# Patient Record
Sex: Female | Born: 1960 | Race: White | Hispanic: No | Marital: Married | Smoking: Never smoker
Health system: Southern US, Community
[De-identification: ages and names within clinical notes are randomized; demographics above are authoritative.]

## PROBLEM LIST (undated history)

## (undated) DIAGNOSIS — R011 Cardiac murmur, unspecified: Secondary | ICD-10-CM

## (undated) DIAGNOSIS — Z8371 Family history of colonic polyps: Secondary | ICD-10-CM

## (undated) DIAGNOSIS — Z83719 Family history of colon polyps, unspecified: Secondary | ICD-10-CM

## (undated) DIAGNOSIS — IMO0002 Reserved for concepts with insufficient information to code with codable children: Secondary | ICD-10-CM

## (undated) DIAGNOSIS — R7611 Nonspecific reaction to tuberculin skin test without active tuberculosis: Secondary | ICD-10-CM

## (undated) DIAGNOSIS — E785 Hyperlipidemia, unspecified: Secondary | ICD-10-CM

## (undated) DIAGNOSIS — M722 Plantar fascial fibromatosis: Secondary | ICD-10-CM

## (undated) DIAGNOSIS — M810 Age-related osteoporosis without current pathological fracture: Secondary | ICD-10-CM

## (undated) DIAGNOSIS — H359 Unspecified retinal disorder: Secondary | ICD-10-CM

## (undated) DIAGNOSIS — H269 Unspecified cataract: Secondary | ICD-10-CM

## (undated) DIAGNOSIS — G5600 Carpal tunnel syndrome, unspecified upper limb: Secondary | ICD-10-CM

## (undated) HISTORY — DX: Nonspecific reaction to tuberculin skin test without active tuberculosis: R76.11

## (undated) HISTORY — DX: Family history of colon polyps, unspecified: Z83.719

## (undated) HISTORY — DX: Carpal tunnel syndrome, unspecified upper limb: G56.00

## (undated) HISTORY — DX: Unspecified cataract: H26.9

## (undated) HISTORY — DX: Unspecified retinal disorder: H35.9

## (undated) HISTORY — PX: VITRECTOMY: SHX106

## (undated) HISTORY — DX: Plantar fascial fibromatosis: M72.2

## (undated) HISTORY — DX: Hyperlipidemia, unspecified: E78.5

## (undated) HISTORY — DX: Reserved for concepts with insufficient information to code with codable children: IMO0002

## (undated) HISTORY — DX: Age-related osteoporosis without current pathological fracture: M81.0

## (undated) HISTORY — DX: Family history of colonic polyps: Z83.71

## (undated) HISTORY — PX: OTHER SURGICAL HISTORY: SHX169

## (undated) HISTORY — PX: TUBAL LIGATION: SHX77

## (undated) HISTORY — PX: VEIN BYPASS SURGERY: SHX833

## (undated) HISTORY — DX: Cardiac murmur, unspecified: R01.1

## (undated) HISTORY — PX: COLONOSCOPY W/ POLYPECTOMY: SHX1380

---

## 1992-05-11 DIAGNOSIS — R7611 Nonspecific reaction to tuberculin skin test without active tuberculosis: Secondary | ICD-10-CM

## 1992-05-11 HISTORY — DX: Nonspecific reaction to tuberculin skin test without active tuberculosis: R76.11

## 1996-05-11 HISTORY — PX: RADIAL KERATOTOMY: SHX217

## 2000-11-02 ENCOUNTER — Other Ambulatory Visit: Admission: RE | Admit: 2000-11-02 | Discharge: 2000-11-02 | Payer: Self-pay | Admitting: Obstetrics & Gynecology

## 2001-10-17 ENCOUNTER — Encounter: Payer: Self-pay | Admitting: Gastroenterology

## 2001-10-17 ENCOUNTER — Ambulatory Visit (HOSPITAL_COMMUNITY): Admission: RE | Admit: 2001-10-17 | Discharge: 2001-10-17 | Payer: Self-pay | Admitting: Gastroenterology

## 2001-10-19 ENCOUNTER — Other Ambulatory Visit: Admission: RE | Admit: 2001-10-19 | Discharge: 2001-10-19 | Payer: Self-pay | Admitting: Obstetrics & Gynecology

## 2002-12-20 ENCOUNTER — Other Ambulatory Visit: Admission: RE | Admit: 2002-12-20 | Discharge: 2002-12-20 | Payer: Self-pay | Admitting: Obstetrics & Gynecology

## 2002-12-20 ENCOUNTER — Encounter: Admission: RE | Admit: 2002-12-20 | Discharge: 2002-12-20 | Payer: Self-pay | Admitting: Obstetrics & Gynecology

## 2002-12-20 ENCOUNTER — Encounter: Payer: Self-pay | Admitting: Obstetrics & Gynecology

## 2003-03-29 ENCOUNTER — Encounter: Payer: Self-pay | Admitting: Internal Medicine

## 2003-10-01 ENCOUNTER — Encounter: Admission: RE | Admit: 2003-10-01 | Discharge: 2003-10-01 | Payer: Self-pay | Admitting: Internal Medicine

## 2003-12-21 ENCOUNTER — Encounter: Admission: RE | Admit: 2003-12-21 | Discharge: 2003-12-21 | Payer: Self-pay | Admitting: Obstetrics & Gynecology

## 2003-12-21 ENCOUNTER — Other Ambulatory Visit: Admission: RE | Admit: 2003-12-21 | Discharge: 2003-12-21 | Payer: Self-pay | Admitting: Obstetrics & Gynecology

## 2004-03-21 ENCOUNTER — Ambulatory Visit: Payer: Self-pay | Admitting: Internal Medicine

## 2005-01-05 ENCOUNTER — Other Ambulatory Visit: Admission: RE | Admit: 2005-01-05 | Discharge: 2005-01-05 | Payer: Self-pay | Admitting: Obstetrics & Gynecology

## 2005-01-05 ENCOUNTER — Encounter: Admission: RE | Admit: 2005-01-05 | Discharge: 2005-01-05 | Payer: Self-pay | Admitting: Obstetrics & Gynecology

## 2005-03-23 ENCOUNTER — Ambulatory Visit: Payer: Self-pay | Admitting: Internal Medicine

## 2005-05-21 ENCOUNTER — Ambulatory Visit: Payer: Self-pay | Admitting: Internal Medicine

## 2005-08-24 ENCOUNTER — Ambulatory Visit: Payer: Self-pay | Admitting: Internal Medicine

## 2006-01-06 ENCOUNTER — Encounter: Admission: RE | Admit: 2006-01-06 | Discharge: 2006-01-06 | Payer: Self-pay | Admitting: Obstetrics & Gynecology

## 2006-03-26 ENCOUNTER — Ambulatory Visit: Payer: Self-pay | Admitting: Internal Medicine

## 2006-03-26 LAB — CONVERTED CEMR LAB
Alkaline Phosphatase: 30 units/L — ABNORMAL LOW (ref 39–117)
Basophils Absolute: 0 10*3/uL (ref 0.0–0.1)
Basophils Relative: 0.3 % (ref 0.0–1.0)
CO2: 29 meq/L (ref 19–32)
Chloride: 104 meq/L (ref 96–112)
Cholesterol: 188 mg/dL (ref 0–200)
Creatinine, Ser: 0.8 mg/dL (ref 0.4–1.2)
Eosinophil percent: 3.4 % (ref 0.0–5.0)
GFR calc non Af Amer: 82 mL/min
Glucose, Bld: 86 mg/dL (ref 70–99)
Hemoglobin: 14.2 g/dL (ref 12.0–15.0)
LDL Cholesterol: 126 mg/dL — ABNORMAL HIGH (ref 0–99)
Monocytes Absolute: 0.3 10*3/uL (ref 0.2–0.7)
Monocytes Relative: 8.3 % (ref 3.0–11.0)
Neutro Abs: 2.6 10*3/uL (ref 1.4–7.7)
Potassium: 4.1 meq/L (ref 3.5–5.1)
RDW: 12.2 % (ref 11.5–14.6)
Total Protein: 6.9 g/dL (ref 6.0–8.3)
WBC: 4.2 10*3/uL — ABNORMAL LOW (ref 4.5–10.5)

## 2006-08-06 ENCOUNTER — Ambulatory Visit: Payer: Self-pay | Admitting: Gastroenterology

## 2006-08-06 ENCOUNTER — Ambulatory Visit: Payer: Self-pay | Admitting: Internal Medicine

## 2006-08-06 LAB — CONVERTED CEMR LAB
HDL: 45.7 mg/dL (ref >39.0)
Triglycerides: 64 mg/dL (ref 0–149)

## 2006-09-27 ENCOUNTER — Ambulatory Visit: Payer: Self-pay | Admitting: Gastroenterology

## 2007-01-07 ENCOUNTER — Encounter: Admission: RE | Admit: 2007-01-07 | Discharge: 2007-01-07 | Payer: Self-pay | Admitting: Obstetrics & Gynecology

## 2007-03-22 ENCOUNTER — Ambulatory Visit: Payer: Self-pay | Admitting: Internal Medicine

## 2007-03-22 DIAGNOSIS — G5600 Carpal tunnel syndrome, unspecified upper limb: Secondary | ICD-10-CM

## 2007-03-22 DIAGNOSIS — E162 Hypoglycemia, unspecified: Secondary | ICD-10-CM

## 2007-03-22 DIAGNOSIS — E7800 Pure hypercholesterolemia, unspecified: Secondary | ICD-10-CM | POA: Insufficient documentation

## 2007-03-22 DIAGNOSIS — E782 Mixed hyperlipidemia: Secondary | ICD-10-CM

## 2007-03-22 DIAGNOSIS — R7611 Nonspecific reaction to tuberculin skin test without active tuberculosis: Secondary | ICD-10-CM | POA: Insufficient documentation

## 2007-03-22 DIAGNOSIS — Z86010 Personal history of colonic polyps: Secondary | ICD-10-CM

## 2007-03-22 LAB — CONVERTED CEMR LAB
Cholesterol, target level: 200 mg/dL
HDL goal, serum: 40 mg/dL
LDL Goal: 160 mg/dL

## 2007-03-30 ENCOUNTER — Encounter (INDEPENDENT_AMBULATORY_CARE_PROVIDER_SITE_OTHER): Payer: Self-pay | Admitting: *Deleted

## 2007-03-30 LAB — CONVERTED CEMR LAB
ALT: 23 units/L (ref 0–35)
Alkaline Phosphatase: 31 units/L — ABNORMAL LOW (ref 39–117)
Cholesterol: 179 mg/dL (ref 0–200)
Glucose, Bld: 85 mg/dL (ref 70–99)
HCT: 37.4 % (ref 36.0–46.0)
HDL: 55.9 mg/dL (ref >39.0)
Hemoglobin: 12.9 g/dL (ref 12.0–15.0)
MCHC: 34.5 g/dL (ref 30.0–36.0)
Monocytes Absolute: 0.3 10*3/uL (ref 0.2–0.7)
Monocytes Relative: 6.2 % (ref 3.0–11.0)
Potassium: 3.6 meq/L (ref 3.5–5.1)
RDW: 13 % (ref 11.5–14.6)
Sodium: 138 meq/L (ref 135–145)
Total Bilirubin: 1 mg/dL (ref 0.3–1.2)
Total Protein: 6.8 g/dL (ref 6.0–8.3)
Triglycerides: 72 mg/dL (ref 0–149)
WBC: 5.5 10*3/uL (ref 4.5–10.5)

## 2008-01-04 ENCOUNTER — Encounter: Admission: RE | Admit: 2008-01-04 | Discharge: 2008-01-04 | Payer: Self-pay | Admitting: Obstetrics & Gynecology

## 2008-03-21 ENCOUNTER — Ambulatory Visit: Payer: Self-pay | Admitting: Internal Medicine

## 2008-04-02 ENCOUNTER — Encounter (INDEPENDENT_AMBULATORY_CARE_PROVIDER_SITE_OTHER): Payer: Self-pay | Admitting: *Deleted

## 2008-05-29 ENCOUNTER — Ambulatory Visit: Payer: Self-pay | Admitting: Internal Medicine

## 2008-05-29 LAB — CONVERTED CEMR LAB: OCCULT 3: NEGATIVE

## 2008-05-30 ENCOUNTER — Encounter (INDEPENDENT_AMBULATORY_CARE_PROVIDER_SITE_OTHER): Payer: Self-pay | Admitting: *Deleted

## 2008-12-31 ENCOUNTER — Encounter: Admission: RE | Admit: 2008-12-31 | Discharge: 2008-12-31 | Payer: Self-pay | Admitting: Obstetrics & Gynecology

## 2009-03-21 ENCOUNTER — Ambulatory Visit: Payer: Self-pay | Admitting: Internal Medicine

## 2009-03-21 DIAGNOSIS — M2550 Pain in unspecified joint: Secondary | ICD-10-CM | POA: Insufficient documentation

## 2009-03-28 ENCOUNTER — Ambulatory Visit: Payer: Self-pay | Admitting: Internal Medicine

## 2009-03-28 LAB — CONVERTED CEMR LAB: OCCULT 1: NEGATIVE

## 2009-03-29 ENCOUNTER — Encounter (INDEPENDENT_AMBULATORY_CARE_PROVIDER_SITE_OTHER): Payer: Self-pay | Admitting: *Deleted

## 2009-04-11 ENCOUNTER — Encounter: Payer: Self-pay | Admitting: Internal Medicine

## 2009-05-20 ENCOUNTER — Encounter: Payer: Self-pay | Admitting: Internal Medicine

## 2009-05-30 ENCOUNTER — Encounter: Payer: Self-pay | Admitting: Internal Medicine

## 2009-11-30 ENCOUNTER — Emergency Department (HOSPITAL_COMMUNITY): Admission: EM | Admit: 2009-11-30 | Discharge: 2009-11-30 | Payer: Self-pay | Admitting: Emergency Medicine

## 2009-11-30 ENCOUNTER — Encounter: Payer: Self-pay | Admitting: Orthopedic Surgery

## 2009-12-02 ENCOUNTER — Ambulatory Visit: Payer: Self-pay | Admitting: Orthopedic Surgery

## 2009-12-02 DIAGNOSIS — S52539A Colles' fracture of unspecified radius, initial encounter for closed fracture: Secondary | ICD-10-CM

## 2009-12-13 ENCOUNTER — Encounter: Admission: RE | Admit: 2009-12-13 | Discharge: 2009-12-13 | Payer: Self-pay | Admitting: Obstetrics & Gynecology

## 2009-12-16 ENCOUNTER — Ambulatory Visit: Payer: Self-pay | Admitting: Orthopedic Surgery

## 2010-01-06 ENCOUNTER — Ambulatory Visit: Payer: Self-pay | Admitting: Orthopedic Surgery

## 2010-03-21 ENCOUNTER — Ambulatory Visit: Payer: Self-pay | Admitting: Internal Medicine

## 2010-03-21 ENCOUNTER — Encounter: Payer: Self-pay | Admitting: Internal Medicine

## 2010-03-21 DIAGNOSIS — I456 Pre-excitation syndrome: Secondary | ICD-10-CM | POA: Insufficient documentation

## 2010-03-21 DIAGNOSIS — M25559 Pain in unspecified hip: Secondary | ICD-10-CM | POA: Insufficient documentation

## 2010-03-24 LAB — CONVERTED CEMR LAB
AST: 24 units/L (ref 0–37)
Albumin: 4.4 g/dL (ref 3.5–5.2)
Alkaline Phosphatase: 40 units/L (ref 39–117)
BUN: 15 mg/dL (ref 6–23)
Basophils Absolute: 0 10*3/uL (ref 0.0–0.1)
Calcium: 9.3 mg/dL (ref 8.4–10.5)
Chloride: 102 meq/L (ref 96–112)
Creatinine, Ser: 0.8 mg/dL (ref 0.4–1.2)
Glucose, Bld: 86 mg/dL (ref 70–99)
HCT: 40.3 % (ref 36.0–46.0)
HDL: 52.4 mg/dL (ref >39.00)
Lymphs Abs: 1 10*3/uL (ref 0.7–4.0)
Potassium: 4 meq/L (ref 3.5–5.1)
RBC: 4.58 M/uL (ref 3.87–5.11)
RDW: 12.2 % (ref 11.5–14.6)
Sodium: 139 meq/L (ref 135–145)
Total CHOL/HDL Ratio: 3
Total Protein: 6.6 g/dL (ref 6.0–8.3)
VLDL: 15.6 mg/dL (ref 0.0–40.0)

## 2010-05-11 DIAGNOSIS — M722 Plantar fascial fibromatosis: Secondary | ICD-10-CM

## 2010-05-11 HISTORY — DX: Plantar fascial fibromatosis: M72.2

## 2010-06-08 LAB — CONVERTED CEMR LAB
ALT: 22 units/L (ref 0–35)
BUN: 11 mg/dL (ref 6–23)
BUN: 13 mg/dL (ref 6–23)
Basophils Absolute: 0 10*3/uL (ref 0.0–0.1)
Bilirubin, Direct: 0.1 mg/dL (ref 0.0–0.3)
CO2: 31 meq/L (ref 19–32)
Calcium: 9.1 mg/dL (ref 8.4–10.5)
Chloride: 108 meq/L (ref 96–112)
Cholesterol, target level: 200 mg/dL
Cholesterol: 157 mg/dL (ref 0–200)
Cholesterol: 174 mg/dL (ref 0–200)
Creatinine, Ser: 0.7 mg/dL (ref 0.4–1.2)
Creatinine, Ser: 0.8 mg/dL (ref 0.4–1.2)
GFR calc non Af Amer: 80.95 mL/min (ref >60)
HCT: 38.7 % (ref 36.0–46.0)
HCT: 41.7 % (ref 36.0–46.0)
HDL: 42.6 mg/dL (ref >39.0)
HDL: 49.3 mg/dL (ref >39.00)
Hemoglobin: 14 g/dL (ref 12.0–15.0)
Lymphocytes Relative: 26.3 % (ref 12.0–46.0)
Lymphocytes Relative: 26.7 % (ref 12.0–46.0)
Lymphs Abs: 1.2 10*3/uL (ref 0.7–4.0)
MCHC: 34.6 g/dL (ref 30.0–36.0)
MCV: 90.6 fL (ref 78.0–100.0)
Monocytes Absolute: 0.3 10*3/uL (ref 0.1–1.0)
Monocytes Absolute: 0.3 10*3/uL (ref 0.1–1.0)
Monocytes Relative: 7 % (ref 3.0–12.0)
Platelets: 145 10*3/uL — ABNORMAL LOW (ref 150–400)
Potassium: 4.2 meq/L (ref 3.5–5.1)
RBC: 4.27 M/uL (ref 3.87–5.11)
RBC: 4.58 M/uL (ref 3.87–5.11)
RDW: 11.8 % (ref 11.5–14.6)
RDW: 11.9 % (ref 11.5–14.6)
Total CHOL/HDL Ratio: 4
Total Protein: 6.5 g/dL (ref 6.0–8.3)
Total Protein: 6.7 g/dL (ref 6.0–8.3)
Triglycerides: 50 mg/dL (ref 0–149)
Triglycerides: 78 mg/dL (ref 0.0–149.0)
VLDL: 10 mg/dL (ref 0–40)
VLDL: 15.6 mg/dL (ref 0.0–40.0)
WBC: 4.3 10*3/uL — ABNORMAL LOW (ref 4.5–10.5)
WBC: 4.4 10*3/uL — ABNORMAL LOW (ref 4.5–10.5)

## 2010-06-12 NOTE — Assessment & Plan Note (Signed)
Summary: AP ER FOL/UP FX DIST RT RADIUS/XR AP 11/30/09/PPO INS/CAF   Visit Type:  new patient Referring Provider:  ap er Primary Provider:  Dr. Marga Melnick  CC:  right wrist.  History of Present Illness: I saw Yvonne Hall in the office today for an initial visit.  She is a 50 years old woman with the complaint of:  right wrist fracture.  DOI  11/30/09 fell  Date of injury and mechanism fall, quality of pain throbbing, severity 5 but relieved with Tylenol didn't tolerate Percocet  Timing constant  Improve with rest worse with movement  Other symptoms and signs include swelling  Xrays Rt wrist and forearm [facility] 11/30/09  Meds: Pravastatin, Prometrium, Percocet 5 from er number 20, does not take.    Allergies: 1)  Crestor  Past History:  Past Medical History: Hyperlipidemia, LDL goal < 110 Colonic polyps, hx of, Dr Russella Dar + PPD 1994; 6 months INH & Rifampin ; CTS (splints) ; Hypoglycemia,PMH of ; Dysplastic lesion R shoulder 2009,Dr Danella Deis pre menopause  Family History: Father: melanoma,  colon polyps Mother: DM,HTN, uterine fibroids, TCP(platelet count < 100,000) Siblings:bro HTN; sister melanoma, basal cell cancers, polycystic ovaries ; MGF MI < 55; PGF colon CA,prostate CA, melanoma; P aunt lipids,ERD,Osteopenia,polyps; P aunt melanoma, MVP,ovarian CA; M aunt RA,HTN; MGM MI @ 76,HTN,lipids,CVA,DM, thyroid disease; MGF MI @ 33 FH of Cancer:  Family History of Diabetes Family History Coronary Heart Disease female < 40 Family History of Arthritis  Social History: [employer] Occupation: Engineer, civil (consulting) Married Never Smoked Alcohol use-no Regular exercise-yes:CVE (Zumba, Elliptical) 4 hrs/week w/o symptoms no caffeine BSN  Review of Systems Constitutional:  Denies weight loss, weight gain, fever, chills, and fatigue. Cardiovascular:  Denies chest pain, palpitations, fainting, and murmurs. Respiratory:  Denies short of breath, wheezing, couch, tightness, pain on  inspiration, and snoring . Gastrointestinal:  Denies heartburn, nausea, vomiting, diarrhea, constipation, and blood in your stools. Genitourinary:  Denies frequency, urgency, difficulty urinating, painful urination, flank pain, and bleeding in urine. Neurologic:  Denies numbness, tingling, unsteady gait, dizziness, tremors, and seizure. Musculoskeletal:  Denies joint pain, swelling, instability, stiffness, redness, heat, and muscle pain. Endocrine:  Denies excessive thirst, exessive urination, and heat or cold intolerance. Psychiatric:  Denies nervousness, depression, anxiety, and hallucinations. Skin:  Denies changes in the skin, poor healing, rash, itching, and redness. HEENT:  Denies blurred or double vision, eye pain, redness, and watering. Immunology:  Denies seasonal allergies, sinus problems, and allergic to bee stings. Hemoatologic:  Denies easy bleeding and brusing.  Physical Exam  Additional Exam:  Normal Appearance,   Oriented x 3, Mood normal  Inspection Tenderness and swelling over the RIGHT distal radius  Range of motion was diminished with decreased range of motion wrist and hand  [name] was stable  Motor grade grip was 3  Skin was intact with no rash  Vital signs were recorded stable  Her LEFT upper extremity revealed no swelling or tenderness full range of motion normal strength and no instability without contracture subluxation atrophy or tremor  her gait pattern was normal      Impression & Recommendations:  Problem # 1:  COLLES' FRACTURE (ICD-813.41) Assessment New  The x-rays were done at [facility]. The report and the films have been reviewed. nondisplaced fracture distal radius  Orders: New Patient Level III (10272) Wrist x-ray complete, minimum 3 views (73110) Distal Radius Fx (53664)  Patient Instructions: 1)  X-Ray in 2 weeks in the cast

## 2010-06-12 NOTE — Assessment & Plan Note (Signed)
Summary: 2 WK RE-CK/XRAY RT RADIUS/[facility]/CAF   Visit Type:  Follow-up Referring Provider:  ap er Primary Provider:  Dr. Marga Melnick  CC:  fx care rt wrist.  History of Present Illness: I saw Brittnei Valido in the office today for an initial visit.  She is a 50 years old woman with the complaint of:  right wrist fracture.  [Problem # 1:  COLLES' FRACTURE (ICD-813.41) Assessment New  The x-rays were done at [facility] The report and the films have been reviewed. nondisplaced fracture distal radius]  DOI  11/30/09 fell  Date of injury and mechanism fall, quality of pain throbbing, severity 5 but relieved with Tylenol didn't tolerate Percocet  Meds: Pravastatin, Prometrium, Tylenol and Ibuprofen helps the pain.  Today is a 2 week recheck xray right wrist in cast.  This is post injury day #15      Allergies: 1)  Crestor   Impression & Recommendations:  Problem # 1:  COLLES' FRACTURE (ICD-813.41) Assessment Comment Only  Xrays 3 views right wrist   non displaced fracture of the right distal radius   no chnge in position   continue cast treatment   Orders: Post-Op Check (95621) Wrist x-ray complete, minimum 3 views (30865)  Patient Instructions: 1)  xrays in 3 weeks

## 2010-06-12 NOTE — Letter (Signed)
Summary: Work [facility]  [address]  [place], [place] [address]   Phone: [phone]  Fax: [phone]     Today's Date: December 02, 2009  Name of Patient: Yvonne Hall  The above named patient had a medical visit today at:  2:30 pm.  Please take this into consideration when reviewing the time away from work    [ X ] To return to the normal work schedule today, 12/02/09, following appointment.  Arly.Keller ] Next scheduled appointment on 12/16/09 at 9:00am ______________________.  [ X ] Other __Limited computer use and writing, secondary to cast  ________________________________________________________________________   Sincerely yours,   Terrance Mass, MD

## 2010-06-12 NOTE — Letter (Signed)
Summary: History form  History form   Imported By: Jacklynn Ganong 12/04/2009 08:13:53  _____________________________________________________________________  External Attachment:    Type:   Image     Comment:   External Document

## 2010-06-12 NOTE — Medication Information (Signed)
Summary: Possible Nonadherence with Pravastatin Sodium/[facility]  Possible Nonadherence with Pravastatin Sodium/[facility]   Imported By: Lanelle Bal 06/13/2009 13:43:32  _____________________________________________________________________  External Attachment:    Type:   Image     Comment:   External Document

## 2010-06-12 NOTE — Assessment & Plan Note (Signed)
Summary: cpx/ns/kdc   Vital Signs:  Patient profile:   50 year old female Height:      61.5 inches Weight:      154.4 pounds BMI:     28.80 Temp:     98.2 degrees F oral Pulse rate:   72 / minute Resp:     14 per minute BP sitting:   118 / 64  (left arm) Cuff size:   regular  Vitals Entered By: Shonna Chock CMA (March 21, 2010 8:35 AM)   Primary Care Provider:  Dr. Marga Melnick  CC:  Lower Extremity Joint pain.  History of Present Illness:         Yvonne Hall is here for a physical; she has had some hip pain.  Hyperlipidemia Follow-Up       The patient reports chronic  constipation, but denies muscle aches, GI upset, abdominal pain, flushing, itching, diarrhea, and fatigue.  Other symptoms include rare  palpitations, < 1 week, none with Elliptical for 30 min.  The patient denies the following symptoms: chest pain/pressure, exercise intolerance, dypsnea, syncope, and pedal edema.  Compliance with medications (by patient report) has been near 100%.  Dietary compliance has been good.  The patient reports exercising 5 X per week.  Adjunctive measures currently used by the patient include ASA, folic acid, and fish oil supplements. Lower Extremity Joint Pain       The patient denies swelling, redness, giving away, locking, popping, stiffness for >1 hr, decreased ROM, and weakness.  The pain is located in the right hip.  The pain began gradually and with no injury. It is worse in RLLD position & sitting for prolonged periods. The pain is described as aching and intermittent.  The patient denies the following symptoms: fever, rash, photosensitivity, eye symptoms, and dysuria.    Lipid Management History:      Positive NCEP/ATP III risk factors include family history for ischemic heart disease (females less than 67 years old & males less than 40 years old).  Negative NCEP/ATP III risk factors include female age less than 27 years old, no history of early menopause without estrogen hormone  replacement, non-diabetic, non-tobacco-user status, non-hypertensive, no ASHD (atherosclerotic heart disease), no prior stroke/TIA, no peripheral vascular disease, and no history of aortic aneurysm.     Current Medications (verified): 1)  Pravastatin Sodium 40 Mg  Tabs (Pravastatin Sodium) .... Take One Half Tablet At Bedtime 2)  Baby Asa One Three Times A Week 3)  Vit B6 100mg  Qd 4)  Lecithin 400mg  Qd 5)  Vit E 400 Iu Qd 6)  Fish Oil 1 Gram Qd 7)  Calcium 600 With Vit D 8)  Multivitamin 9)  Folic Acid 400 Iu Qd  Allergies: 1)  Crestor  Past History:  Past Medical History: Hyperlipidemia, LDL goal < 110 based on NMR Lipoprofile. Framingham Study LDL goal = < 160. Colonic polyps, PMH  of, Dr Russella Dar + PPD 1994; 6 months INH & Rifampin ; CTS (splints) ; Hypoglycemia,PMH of ; Dysplastic lesion R shoulder 2009,Dr Campbell Stall Pre menopausal status; Fracture R wrist 2011, Dr Fuller [place], Orthopedist  Past Surgical History: Tubal ligation; Radial keratotomy bilaterally Colon polypectomy 2003; repeat negative  2008 (due ? 2013), Dr Modesto Charon 3 P2  Caesarean section X 2; Wide resection R shoulder dysplastic lesion  Family History: Father: melanoma,  colon polyps Mother: DM,HTN, uterine fibroids, TCP (platelet count < 100,000) Siblings:bro: HTN; sister: melanoma, basal cell cancers, polycystic ovaries ; MGF: MI <  55; PGF :colon cancer,prostate cancer,melanoma; P aunt: lipids,ERD,Osteopenia,polyps; P aunt :melanoma, MVP,ovarian cancer ; M aunt :RA,HTN; MGM: MI @ 76,HTN,lipids,CVA,DM, thyroid disease; MGF: MI @ 35  Family History Coronary Heart Disease female < 67  Social History: [employer] Occupation: Engineer, 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         Weight up 5# post  wrist fracture .  Physical Exam  General:  well-nourished;alert,appropriate and cooperative throughout examination Head:  Normocephalic and atraumatic without obvious abnormalities. No apparent alopecia or balding. Eyes:  No corneal or conjunctival inflammation noted.Perrla. Funduscopic exam benign, without hemorrhages, exudates or papilledema.  Ears:  External ear exam shows no significant lesions or deformities.  Otoscopic examination reveals clear canals, tympanic membranes are intact bilaterally without bulging, retraction, inflammation or discharge. Hearing is grossly normal bilaterally. Nose:  External nasal examination shows no deformity or inflammation. Nasal mucosa are pink and moist without lesions or exudates. Mouth:  Oral mucosa and oropharynx without lesions or exudates.  Teeth in good repair. Neck:  No deformities, masses, or tenderness noted. Minimal asymmetry of thyroid lobes w/o nodules Lungs:  Normal respiratory effort, chest expands symmetrically. Lungs are clear to auscultation, no crackles or wheezes. Heart:  Normal rate and regular rhythm. S1 and S2 normal without gallop, murmur, click, rub.S4with  slurring Abdomen:  Bowel sounds positive,abdomen soft and non-tender without masses, organomegaly or hernias noted. Genitalia:  Dr Merry Lofty Msk:  No deformity or scoliosis noted of thoracic or lumbar spine.   Pulses:  R and L carotid,radial,dorsalis pedis and posterior tibial pulses are full and equal bilaterally Extremities:  No clubbing, cyanosis, edema, or deformity noted with normal full range of motion of all joints.  Pain R inferior buttocks with ROM   Neurologic:  alert & oriented X3, strength normal in all extremities, and DTRs symmetrical and normal.   Skin:  Intact without suspicious lesions or  rashes Cervical Nodes:  No lymphadenopathy noted Axillary Nodes:  No palpable lymphadenopathy Psych:  memory intact for recent and remote, normally interactive, and good eye contact.     Impression & Recommendations:  Problem # 1:  ROUTINE GENERAL MEDICAL EXAM@HEALTH  CARE FACL (ICD-V70.0)  Orders: EKG w/ Interpretation (93000) Venipuncture (44010) TLB-Lipid Panel (80061-LIPID) TLB-BMP (Basic Metabolic Panel-BMET) (80048-METABOL) TLB-CBC Platelet - w/Differential (85025-CBCD) TLB-Hepatic/Liver Function Pnl (80076-HEPATIC) TLB-TSH (Thyroid Stimulating Hormone) (84443-TSH)  Problem # 2:  HIP PAIN, RIGHT (ICD-719.45) Orthopedic referral declined  Problem # 3:  HYPERLIPIDEMIA (ICD-272.2)  Her updated medication list for this problem includes:    Pravastatin Sodium 40 Mg Tabs (Pravastatin sodium) .Marland Kitchen... Take one half tablet at bedtime  Problem # 4:  NONSPECIFIC ABNORMAL ELECTROCARDIOGRAM (ICD-794.31) short PR, PACs;asymptomatic even @ high level CVE  Complete Medication List: 1)  Pravastatin Sodium 40 Mg Tabs (Pravastatin sodium) .... Take one half tablet at bedtime 2)  Baby Asa One Three Times A Week  3)  Vit B6 100mg  Qd  4)  Lecithin 400mg  Qd  5)  Vit E 400 Iu Qd  6)  Fish Oil 1 Gram Qd  7)  Calcium 600 With Vit D  8)  Multivitamin  9)  Folic Acid 400 Iu Qd   Lipid Assessment/Plan:      Based on NCEP/ATP III, the  patient's risk factor category is "0-1 risk factors".  The patient's lipid goals are as follows: Total cholesterol goal is 200; LDL cholesterol goal is 110; HDL cholesterol goal is 50; Triglyceride goal is 150.  Her LDL cholesterol goal has been met.    Patient Instructions: 1)  Soak in tub @ end of work day. Glucosamine sulfate 1500 mg once daily X 6 weeks. Orthopedic referral if no better. Aleve 1-2 every 8-12 hrs with food as needed . Avoid stimulants as discussed. Report increase in palpitations, especially with exercise. Prescriptions: PRAVASTATIN SODIUM 40 MG   TABS (PRAVASTATIN SODIUM) take one half tablet at bedtime  #90 x 1   Entered and Authorized by:   Marga Melnick MD   Signed by:   Marga Melnick MD on 03/21/2010   Method used:   Print then Give to Patient   RxID:   [id number]    Orders Added: 1)  Est. Patient 40-64 years [99396] 2)  EKG w/ Interpretation [93000] 3)  Venipuncture [36415] 4)  TLB-Lipid Panel [80061-LIPID] 5)  TLB-BMP (Basic Metabolic Panel-BMET) [80048-METABOL] 6)  TLB-CBC Platelet - w/Differential [85025-CBCD] 7)  TLB-Hepatic/Liver Function Pnl [80076-HEPATIC] 8)  TLB-TSH (Thyroid Stimulating Hormone) [29528-UXL]     Appended Document: cpx/ns/kdc

## 2010-06-12 NOTE — Consult Note (Signed)
Summary: [facility]   Imported By: Lanelle Bal 06/07/2009 12:52:18  _____________________________________________________________________  External Attachment:    Type:   Image     Comment:   External Document

## 2010-06-12 NOTE — Assessment & Plan Note (Signed)
Summary: 3 WK RE-CK/XRAY RT RADIUS/[facility]/CAF   Visit Type:  Follow-up Referring Cailee Blanke:  ap er Primary Anvika Gashi:  Dr. Marga Melnick  CC:  fx care rt wrist.  History of Present Illness: I saw Yvonne Hall in the office today for an initial visit.  She is a 50 years old woman with the complaint of:  right wrist fracture.  [Problem # 1:  COLLES' FRACTURE (ICD-813.41) Assessment New  DOI  11/30/09 fell  Date of injury and mechanism fall, quality of pain throbbing, severity 5 but relieved with Tylenol didn't tolerate Percocet  Meds: Pravastatin, Prometrium, Tylenol and Ibuprofen helps the pain.  Today is 3 week recheck and xray rt wrist.  No pain.  She does have some soreness with the cast off at the wrist joint  X-rays are taken three-view showed complete resolution of the fracture and normal alignment  Patient is given a splint to wear until soreness goes away followup as needed       Allergies: 1)  Crestor   Impression & Recommendations:  Problem # 1:  COLLES' FRACTURE (ICD-813.41) Assessment Improved  Orders: Post-Op Check ([address]) Wrist x-ray complete, minimum 3 views (73110)4/RIGHT wrist  Patient Instructions: 1)  Wear splint until soreness goes away  2)  5# lifting restriction x 2 weeks  3)  Please schedule a follow-up appointment as needed.

## 2010-06-12 NOTE — Letter (Signed)
Summary: Work [facility]  [address]  [place], [place] [address]   Phone: [phone]  Fax: [phone]    Today's Date: December 16, 2009  Name of Patient: Yvonne Hall  The above named patient had a medical visit in our office today.  Arly.Keller  ] Returning to work today as scheduled.  Arly.Keller ] Next scheduled appointment on 01/06/10 at 9:00am ______________________.  [ X ] Other __Limited computer use and writing, secondary to cast  ________________________________________________________________________   Sincerely yours,   Terrance Mass, MD

## 2010-06-12 NOTE — Letter (Signed)
Summary: Work [facility]  [address]  [place], [place] [address]   Phone: [phone]  Fax: [phone]      Today's Date: January 06, 2010  Name of Patient: Yvonne Hall  The above named patient had a medical visit today at:  9:00am  Please take this into consideration when reviewing the time away from work  Special Instructions:    [ X ] To return to work / school schedule today, 01/06/10, with the following restriction:  ** 5 lb lifting restriction through 01/20/10 **  Return to regular work duties 01/21/10   Sincerely,   Sherre Lain D

## 2010-11-11 ENCOUNTER — Other Ambulatory Visit: Payer: Self-pay | Admitting: Obstetrics & Gynecology

## 2010-11-11 DIAGNOSIS — Z1231 Encounter for screening mammogram for malignant neoplasm of breast: Secondary | ICD-10-CM

## 2010-12-19 ENCOUNTER — Ambulatory Visit
Admission: RE | Admit: 2010-12-19 | Discharge: 2010-12-19 | Disposition: A | Discharge status: Home / Self Care | Payer: 59 | Source: Ambulatory Visit | Attending: Obstetrics & Gynecology | Admitting: Obstetrics & Gynecology

## 2010-12-19 DIAGNOSIS — Z1231 Encounter for screening mammogram for malignant neoplasm of breast: Secondary | ICD-10-CM

## 2011-03-20 ENCOUNTER — Encounter: Payer: Self-pay | Admitting: Internal Medicine

## 2011-03-23 ENCOUNTER — Encounter: Payer: Self-pay | Admitting: Internal Medicine

## 2011-03-23 ENCOUNTER — Ambulatory Visit (INDEPENDENT_AMBULATORY_CARE_PROVIDER_SITE_OTHER): Payer: BC Managed Care – PPO | Admitting: Internal Medicine

## 2011-03-23 VITALS — BP 118/68 | HR 82 | Temp 98.3°F | Resp 12 | Ht 61.25 in | Wt 159.0 lb

## 2011-03-23 DIAGNOSIS — M25551 Pain in right hip: Secondary | ICD-10-CM

## 2011-03-23 DIAGNOSIS — M25559 Pain in unspecified hip: Secondary | ICD-10-CM

## 2011-03-23 DIAGNOSIS — Z0000 Encounter for general adult medical examination without abnormal findings: Secondary | ICD-10-CM

## 2011-03-23 DIAGNOSIS — E782 Mixed hyperlipidemia: Secondary | ICD-10-CM

## 2011-03-23 MED ORDER — PRAVASTATIN SODIUM 40 MG PO TABS: 40.0000 mg | ORAL_TABLET | Freq: Every day | ORAL | Status: DC

## 2011-03-23 NOTE — Patient Instructions (Signed)
LDL or BAD cholesterol is mildly elevated , but the HDL (GOOD) > 50  is protective.    Please review Dr Gildardo Griffes book Eat, Drink & Be Healthy for dietary cholesterol information. Preventive Health Care: Exercise  30-45  minutes a day, 3-4 days a week. Walking ( once cleared by Dr Darrick Penna) is especially valuable in preventing Osteoporosis.  Eat a low-fat diet with lots of fruits and vegetables, up to 7-9 servings per day. Consume less than 30 grams of sugar per day from foods & drinks with High Fructose Corn Syrup as # 1,2,3 or #4 on label. Health Care Power of Attorney & Living Will place you in charge of your health care  decisions. Verify these are  in place.

## 2011-03-23 NOTE — Progress Notes (Signed)
Subjective:    Patient ID: Yvonne Hall, female    DOB: Apr 07, 1961, 50 y.o.   MRN: [id number]  HPI  Yvonne Hall  is here for a physical;acute issues include L plantar fasciitis & R hip pain.      Review of Systems Extremity pain Location: R hip Onset:6-8 mos ago Trigger/injury:? Worse due plantar fascitis on L Pain quality:sore & burning Pain severity:up to 5 Duration:constant @ all times Radiation:R buttocks to R thigh Exacerbating factors:squatting Treatment/response:NSAIDS / minimal benefit Review of systems: Constitutional: no fever, chills, sweats, change in weight  Musculoskeletal:no  muscle cramps or pain; no  joint stiffness, redness, or swelling Skin:no rash, color change Neuro: no weakness; incontinence (stool/urine); numbness and tingling Heme:no lymphadenopathy; abnormal bruising or bleeding   Lab studies 11/12 done at her work were reviewed. LDL is 116; goal is less than 110. HDL is protective at 63  The advanced cholesterol panel, MMR lipoprofile, was reviewed to determine optimal LDL goal..  She denies significant chest pain, palpitations, or dyspnea. Her exercise program has  decreased due to to the plantar fasciitis       Objective:   Physical Exam Gen.: Healthy and well-nourished in appearance. Alert, appropriate and cooperative throughout exam. Head: Normocephalic without obvious abnormalities;  Hair fine Eyes: No corneal or conjunctival inflammation noted. Pupils equal round reactive to light and accommodation. Fundal exam is benign without hemorrhages, exudate, papilledema. Extraocular motion intact. Vision grossly normal. Ears: External  ear exam reveals no significant lesions or deformities. Canals clear .TMs normal. Hearing is grossly normal bilaterally. Nose: External nasal exam reveals no deformity or inflammation. Nasal mucosa are pink and moist. No lesions or exudates noted.  Mouth: Oral mucosa and oropharynx reveal no lesions or exudates.  Teeth in good repair. Neck: No deformities, masses, or tenderness noted. Range of motion & . Thyroid normal. Lungs: Normal respiratory effort; chest expands symmetrically. Lungs are clear to auscultation without rales, wheezes, or increased work of breathing. Heart: Normal rate and rhythm. Normal S1 and S2. No gallop, click, or rub. S 4 with slurring; no  murmur. Abdomen: Bowel sounds normal; abdomen soft and nontender. No masses, organomegaly or hernias noted. Genitalia: Dr Aldona Bar   .                                                                                   Musculoskeletal/extremities: No deformity or scoliosis noted of  the thoracic or lumbar spine. No clubbing, cyanosis, edema, or deformity noted. Range of motion  normal .Tone & strength  normal.Joints normal. Nail health  good. She is able to lie flat and sit up without help. Exam of the right hip reveals no deficit or impairment. Vascular: Carotid, radial artery, dorsalis pedis and  posterior tibial pulses are full and equal. No bruits present. Neurologic: Alert and oriented x3. Deep tendon reflexes symmetrical and normal.         Skin: Intact without suspicious lesions or rashes. Lymph: No cervical, axillary  lymphadenopathy present. Psych: Mood and affect are normal. Normally interactive  Assessment & Plan:  #1 comprehensive physical exam; no acute findings #2 see Problem List with Assessments & Recommendations  #3 plantar fasciitis, as per Dr. Ulice Brilliant  #4 right hip pain; rule out piriformis syndrome. This may be related to the plantar fasciitis on the left. Assessment by a Sports Specialist would be recommended. There is no suggestion of a rheumatologic syndrome.  Plan: see Orders   EKG reviewed; shows a short PR interval of 114 ms; this is stable.

## 2011-03-26 ENCOUNTER — Telehealth: Payer: Self-pay | Admitting: Internal Medicine

## 2011-03-26 NOTE — Telephone Encounter (Signed)
I recommended Dr Darrick Penna  because of his international reputation and the fact that he is  a nonsurgical specialist. His expertise crosses rheumatologic &  orthopedic areas. He would be able to definitively define if this were orthopedic in nature. Please let me know ASAP about whether  orthopedic consult preferred. Dr Darrick Penna is the one I would see myself as I have similar symptoms

## 2011-03-26 NOTE — Telephone Encounter (Signed)
Patient calling, states she had done research on Dr. Enid Baas, and feels he may not be the right person she should see for her problem.  Patient states she also spoke with the Physician Assistant at her work, who also examined her, and believes she should see an Orthopedist for the problem, and that this may be coming from her foot, patient seems to agree, but still wants Dr. Frederik Pear input.  She would like to go to [facility].  Please advise.

## 2011-03-26 NOTE — Telephone Encounter (Signed)
Dr.Hopper please advise 

## 2011-03-27 NOTE — Telephone Encounter (Signed)
Left message to call office

## 2011-03-27 NOTE — Telephone Encounter (Signed)
Discuss with patient  

## 2011-04-13 ENCOUNTER — Ambulatory Visit: Payer: BC Managed Care – PPO | Admitting: Sports Medicine

## 2011-06-01 ENCOUNTER — Ambulatory Visit (INDEPENDENT_AMBULATORY_CARE_PROVIDER_SITE_OTHER): Payer: BC Managed Care – PPO | Admitting: Sports Medicine

## 2011-06-01 ENCOUNTER — Encounter: Payer: Self-pay | Admitting: Sports Medicine

## 2011-06-01 VITALS — BP 110/60 | Ht 61.0 in | Wt 162.0 lb

## 2011-06-01 DIAGNOSIS — M25559 Pain in unspecified hip: Secondary | ICD-10-CM

## 2011-06-01 DIAGNOSIS — M722 Plantar fascial fibromatosis: Secondary | ICD-10-CM

## 2011-06-01 NOTE — Patient Instructions (Signed)
For the next several days take 2-3 ibuprofen 2 times per day  It is ok to start back exercising at low intensity for 15-20 minutes at a time  Do plantar fascia and hip exercises daily  Please follow up in 6 weeks  Thank you for seeing [place] today!

## 2011-06-01 NOTE — Assessment & Plan Note (Signed)
Since she cannot wear the custom orthotics we replaced her insoles with a sports insole  We also gave her an arch strap to try  Begin a series of stretches and exercises  Ice at the end of activity

## 2011-06-01 NOTE — Assessment & Plan Note (Signed)
I suspect that this arose when she was favoring the left foot When she placed more stress on the right side she developed some strain of the muscles and tendons around the right hip Now she has significant weakness and some popping She is hypermobile only around the pelvis and hip which probably contributes to this  We will give her a strength program start. back some walking and exercise  Recheck hip strength in 6 wks

## 2011-06-01 NOTE — Progress Notes (Signed)
  Subjective:    Patient ID: Yvonne Hall, female    DOB: 27-Jul-1960, 51 y.o.   MRN: [id number]  HPI  Referred courtesy of Dr Alwyn Ren  Pt presents to clinic for evaluation of rt hip pain that started about 1 yr ago, she also had problems with plantar fasciitis before and during this time.  States she has burning down her rt leg into hamstring.  Feels and hears a popping in her rt hip when she walks.  Has been unable to do her normal walking at lunch and elliptical 2/2 hip pain.  She works in [place] at [employer] and has worked in [employer] He is usually on tile floors when she stands  Has plantar fasciitis for more than 1 yr on lt which she has had custom orthotics made for.  She still has pain and stiffness in plantar fascia.  The custom orthotics are the rigid style have not been helpful for PF. These feel hard and have increased her pain.     Review of Systems     Objective:   Physical Exam  Excellent ROM rt hip Slight genuvalgus alignment bilat R>L SI joints move freely FABER excellent bilat Straight leg raise past 90 bilat Hip flexion weak on rt, stronger on lt Hip abduction weak on rt not on lt Hip extension strong bilat  Walking gait- slight supination, bilateral genu valgus  Lt foot tender at insertion of PF at heel Arch and foot are neutral       Assessment & Plan:

## 2011-07-13 ENCOUNTER — Ambulatory Visit (INDEPENDENT_AMBULATORY_CARE_PROVIDER_SITE_OTHER): Payer: BC Managed Care – PPO | Admitting: Sports Medicine

## 2011-07-13 VITALS — BP 120/80

## 2011-07-13 DIAGNOSIS — M722 Plantar fascial fibromatosis: Secondary | ICD-10-CM

## 2011-07-13 DIAGNOSIS — M25559 Pain in unspecified hip: Secondary | ICD-10-CM

## 2011-07-13 NOTE — Patient Instructions (Signed)
1. Continue hip and low back exercises daily. 2. Steadily increase walking and physical activity. 3. Continue using arch strap and heel inserts - we will give you a catalogue. 4. Return to clinic or follow up with PCP in 3-4 months.

## 2011-07-13 NOTE — Progress Notes (Signed)
  Subjective:    Patient ID: Yvonne Hall, female    DOB: 05-13-1960, 51 y.o.   MRN: [id number]  HPI  Patient presents for follow up right hip pain and left plantar fasciitis.  Hip pain, right: Patient complains of pain located right buttock that is worse when laying or sitting on right side for prolonged period of time.  Pain is relieved when walking or laying on left side.  She continues to stretch, perform leg lifts/side line lifts, uses an aerobic box.  Uses Ibuprofen about once per week.  Denies any knee, low pack, or right foot pain.  This is at least 50% better and feels stronger.  Plantar fasciitis, left: pain has improved significantly after using arch strap and heel inserts.  Denies any pain with walking now.  Stretches left foot every morning when she wakes up.     Review of Systems  Per HPI    Objective:   Physical Exam Excellent ROM rt hip SI joints move freely FABER excellent bilat Straight leg raise past 90 bilat Hip flexion stronger on rt than previous exam Hip abduction strength much improved from previous exam Hip extension strong bilat Normal gait Lt foot: non-tender at insertion of PF Arch and foot are neutral       Assessment & Plan:

## 2011-07-13 NOTE — Assessment & Plan Note (Signed)
ROM right hip, improved.  Right gluteus medius strength, improved. Continue leg lifts, stretches, exercises daily. Steadily increase walking as tolerated. Recheck in 3-4 months if needed.

## 2011-07-13 NOTE — Assessment & Plan Note (Addendum)
Continue arch strap and sports insole Continue stretches and exercises. Follow up in 3-4 months if needed. Catalogue given for patient to re-order supplies.

## 2011-07-27 ENCOUNTER — Encounter: Payer: Self-pay | Admitting: Gastroenterology

## 2011-08-06 ENCOUNTER — Encounter: Payer: Self-pay | Admitting: Gastroenterology

## 2011-09-18 ENCOUNTER — Ambulatory Visit (AMBULATORY_SURGERY_CENTER): Payer: BC Managed Care – PPO

## 2011-09-18 VITALS — Ht 61.5 in | Wt 163.2 lb

## 2011-09-18 DIAGNOSIS — Z1211 Encounter for screening for malignant neoplasm of colon: Secondary | ICD-10-CM

## 2011-09-18 DIAGNOSIS — Z86010 Personal history of colonic polyps: Secondary | ICD-10-CM

## 2011-09-18 DIAGNOSIS — Z800 Family history of malignant neoplasm of digestive organs: Secondary | ICD-10-CM

## 2011-09-18 MED ORDER — PEG-KCL-NACL-NASULF-NA ASC-C 100 G PO SOLR: 1.0000 | Freq: Once | ORAL | Status: AC

## 2011-09-21 ENCOUNTER — Encounter: Payer: Self-pay | Admitting: Gastroenterology

## 2011-10-02 ENCOUNTER — Ambulatory Visit (AMBULATORY_SURGERY_CENTER): Payer: BC Managed Care – PPO | Admitting: Gastroenterology

## 2011-10-02 ENCOUNTER — Encounter: Payer: Self-pay | Admitting: Gastroenterology

## 2011-10-02 VITALS — BP 106/64 | HR 56 | Temp 95.4°F | Resp 20 | Ht 61.5 in | Wt 163.0 lb

## 2011-10-02 DIAGNOSIS — D126 Benign neoplasm of colon, unspecified: Secondary | ICD-10-CM

## 2011-10-02 DIAGNOSIS — Z800 Family history of malignant neoplasm of digestive organs: Secondary | ICD-10-CM

## 2011-10-02 DIAGNOSIS — Z1211 Encounter for screening for malignant neoplasm of colon: Secondary | ICD-10-CM

## 2011-10-02 MED ORDER — SODIUM CHLORIDE 0.9 % IV SOLN: 500.0000 mL | INTRAVENOUS | Status: DC

## 2011-10-02 NOTE — Patient Instructions (Signed)
YOU HAD AN ENDOSCOPIC PROCEDURE TODAY AT THE [facility]: Refer to the procedure report that was given to you for any specific questions about what was found during the examination.  If the procedure report does not answer your questions, please call your gastroenterologist to clarify.  If you requested that your care partner not be given the details of your procedure findings, then the procedure report has been included in a sealed envelope for you to review at your convenience later.  YOU SHOULD EXPECT: Some feelings of bloating in the abdomen. Passage of more gas than usual.  Walking can help get rid of the air that was put into your GI tract during the procedure and reduce the bloating. If you had a lower endoscopy (such as a colonoscopy or flexible sigmoidoscopy) you may notice spotting of blood in your stool or on the toilet paper. If you underwent a bowel prep for your procedure, then you may not have a normal bowel movement for a few days.  DIET: Your first meal following the procedure should be a light meal and then it is ok to progress to your normal diet.  A half-sandwich or bowl of soup is an example of a good first meal.  Heavy or fried foods are harder to digest and may make you feel nauseous or bloated.  Likewise meals heavy in dairy and vegetables can cause extra gas to form and this can also increase the bloating.  Drink plenty of fluids but you should avoid alcoholic beverages for 24 hours.  ACTIVITY: Your care partner should take you home directly after the procedure.  You should plan to take it easy, moving slowly for the rest of the day.  You can resume normal activity the day after the procedure however you should NOT DRIVE or use heavy machinery for 24 hours (because of the sedation medicines used during the test).    SYMPTOMS TO REPORT IMMEDIATELY: A gastroenterologist can be reached at any hour.  During normal business hours, 8:30 AM to 5:00 PM Monday through Friday,  call [phone].  After hours and on weekends, please call the GI answering service at [phone] who will take a message and have the physician on call contact you.   Following lower endoscopy (colonoscopy or flexible sigmoidoscopy):  Excessive amounts of blood in the stool  Significant tenderness or worsening of abdominal pains  Swelling of the abdomen that is new, acute  Fever of 100F or higher  Following upper endoscopy (EGD)  Vomiting of blood or coffee ground material  New chest pain or pain under the shoulder blades  Painful or persistently difficult swallowing  New shortness of breath  Fever of 100F or higher  Black, tarry-looking stools  FOLLOW UP: If any biopsies were taken you will be contacted by phone or by letter within the next 1-3 weeks.  Call your gastroenterologist if you have not heard about the biopsies in 3 weeks.  Our staff will call the home number listed on your records the next business day following your procedure to check on you and address any questions or concerns that you may have at that time regarding the information given to you following your procedure. This is a courtesy call and so if there is no answer at the home number and we have not heard from you through the emergency physician on call, we will assume that you have returned to your regular daily activities without incident.  SIGNATURES/CONFIDENTIALITY: You and/or your care   partner have signed paperwork which will be entered into your electronic medical record.  These signatures attest to the fact that that the information above on your After Visit Summary has been reviewed and is understood.  Full responsibility of the confidentiality of this discharge information lies with you and/or your care-partner.  

## 2011-10-02 NOTE — Progress Notes (Signed)
Patient did not experience any of the following events: a burn prior to discharge; a fall within the facility; wrong site/side/patient/procedure/implant event; or a hospital transfer or hospital admission upon discharge from the facility. (G8907) Patient did not have preoperative order for IV antibiotic SSI prophylaxis. (G8918)  

## 2011-10-02 NOTE — Op Note (Signed)
[facility] [address] [place], [place]  [address]  COLONOSCOPY PROCEDURE REPORT  PATIENT:  Yvonne Hall, Yvonne Hall  MR#:  [id number] BIRTHDATE:  May 08, 1961, 50 yrs. old  GENDER:  female ENDOSCOPIST:  Judie Petit T. Russella Dar, MD, [facility]  PROCEDURE DATE:  10/02/2011 PROCEDURE:  Colonoscopy with biopsy ASA CLASS:  Class II INDICATIONS:  1) Elevated Risk Screening  2) family history of colon cancer  3) family Hx of polyps ; father in his 62s MEDICATIONS:   These medications were titrated to patient response per physician's verbal order, Fentanyl 75 mcg IV, Versed 7 mg IV DESCRIPTION OF PROCEDURE:   After the risks benefits and alternatives of the procedure were thoroughly explained, informed consent was obtained.  Digital rectal exam was performed and revealed no abnormalities.   The LB CF-[id number] endoscope was introduced through the anus and advanced to the cecum, which was identified by both the appendix and ileocecal valve, limited by a tortuous colon.    The quality of the prep was good, using MoviPrep.  The instrument was then slowly withdrawn as the colon was fully examined. <<PROCEDUREIMAGES>> FINDINGS:  A sessile polyp was found in the cecum. It was 4 mm in size. The polyp was removed using cold biopsy forceps.  A sessile polyp was found in the ascending colon. It was 5 mm in size. The polyp was removed using cold biopsy forceps.  Otherwise normal colonoscopy without other polyps, masses, vascular ectasias, or inflammatory changes.   Retroflexed views in the rectum revealed no abnormalities.    The time to cecum =  5  minutes. The scope was then withdrawn (time =  12.5  min) from the patient and the procedure completed.  COMPLICATIONS:  None  ENDOSCOPIC IMPRESSION: 1) 4 mm sessile polyp in the cecum 2) 5 mm sessile polyp in the ascending colon  RECOMMENDATIONS: 1) Await pathology results 2) Repeat Colonoscopy in 5 years.  Venita Lick. Russella Dar, MD,  Clementeen Graham  n. eSIGNED:   Venita Lick. Malak Duchesneau at 10/02/2011 09:27 AM  [facility], [id number]

## 2011-10-06 ENCOUNTER — Telehealth: Payer: Self-pay | Admitting: *Deleted

## 2011-10-06 NOTE — Telephone Encounter (Signed)
  Follow up Call-  Call back number 10/02/2011  Post procedure Call Back phone  # [phone]  Permission to leave phone message Yes     Patient questions:  Do you have a fever, pain , or abdominal swelling? no Pain Score  0 *  Have you tolerated food without any problems? yes  Have you been able to return to your normal activities? yes  Do you have any questions about your discharge instructions: Diet   no Medications  no Follow up visit  no  Do you have questions or concerns about your Care? no  Actions: * If pain score is 4 or above: No action needed, pain <4.

## 2011-10-07 ENCOUNTER — Encounter: Payer: Self-pay | Admitting: Gastroenterology

## 2011-10-19 DIAGNOSIS — IMO0002 Reserved for concepts with insufficient information to code with codable children: Secondary | ICD-10-CM

## 2011-10-19 HISTORY — DX: Reserved for concepts with insufficient information to code with codable children: IMO0002

## 2011-11-13 IMAGING — CR DG WRIST COMPLETE 3+V*R*
3 series · 3 of 3 positions shown · non-contrast
Comparison: None.

CLINICAL DATA: Fall

RIGHT WRIST - COMPLETE 3+ VIEW

[view not recorded (1 of 3)]
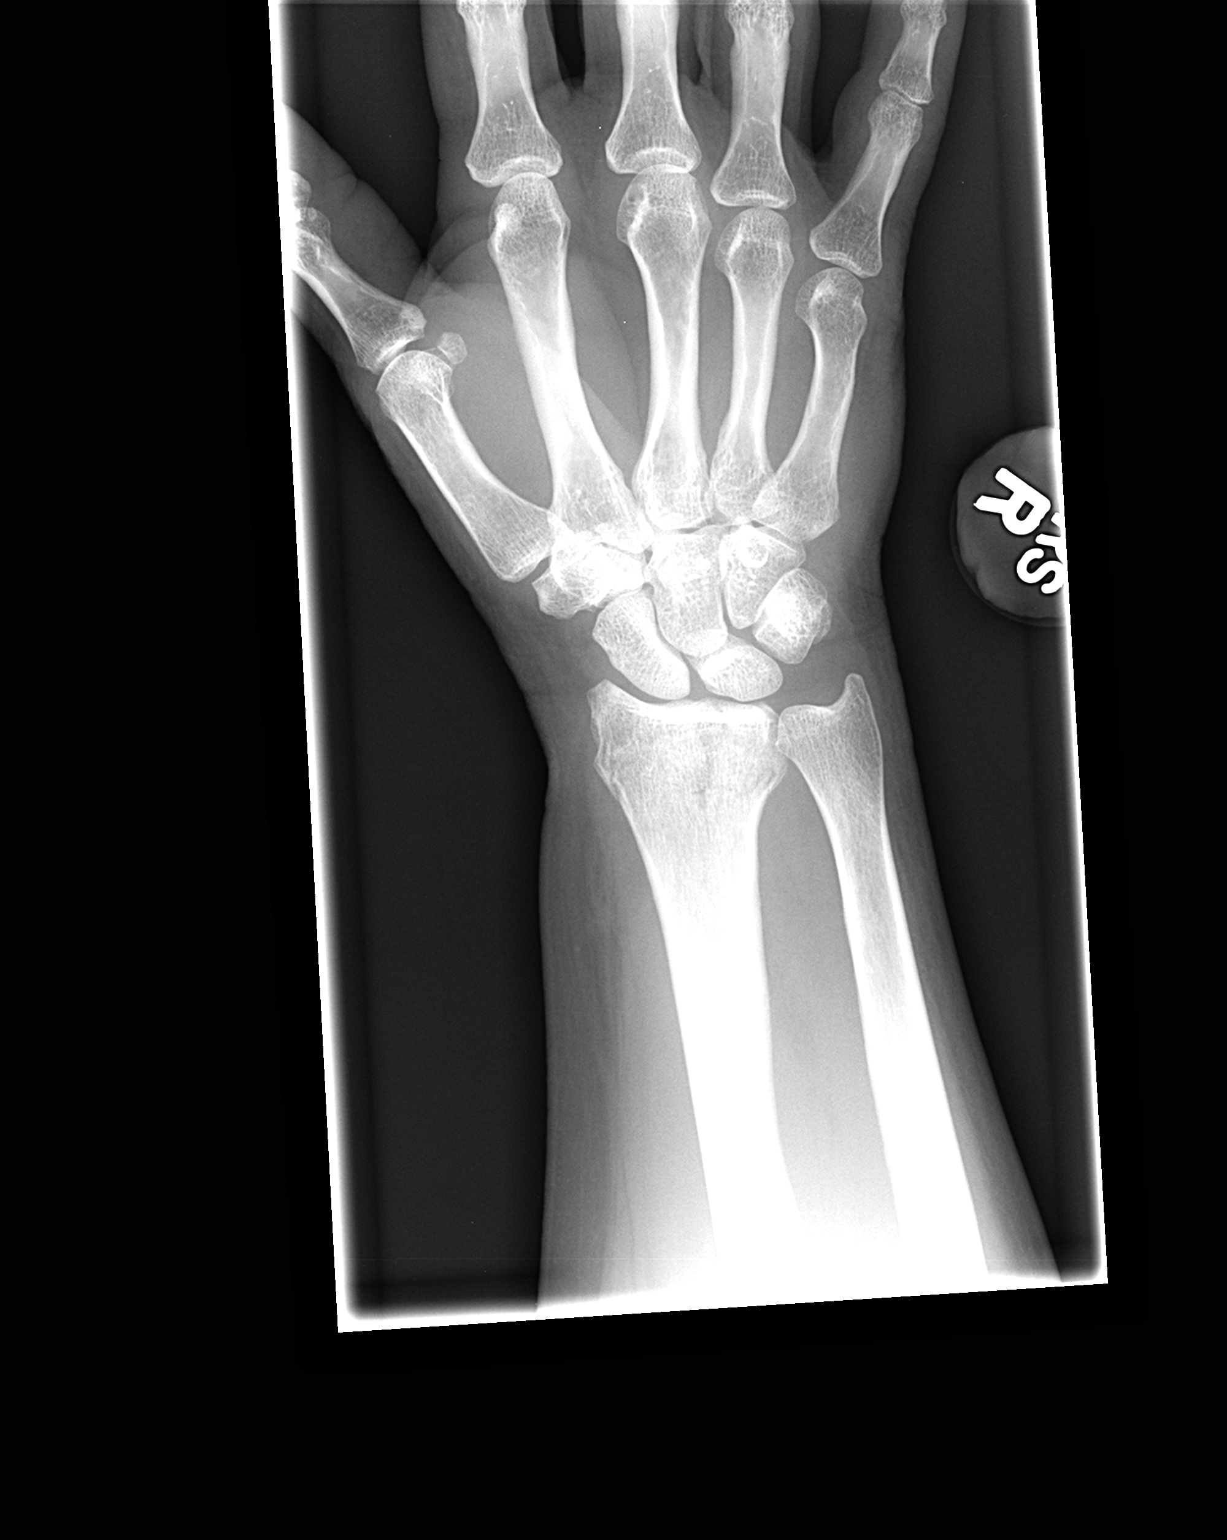

[view not recorded (2 of 3)]
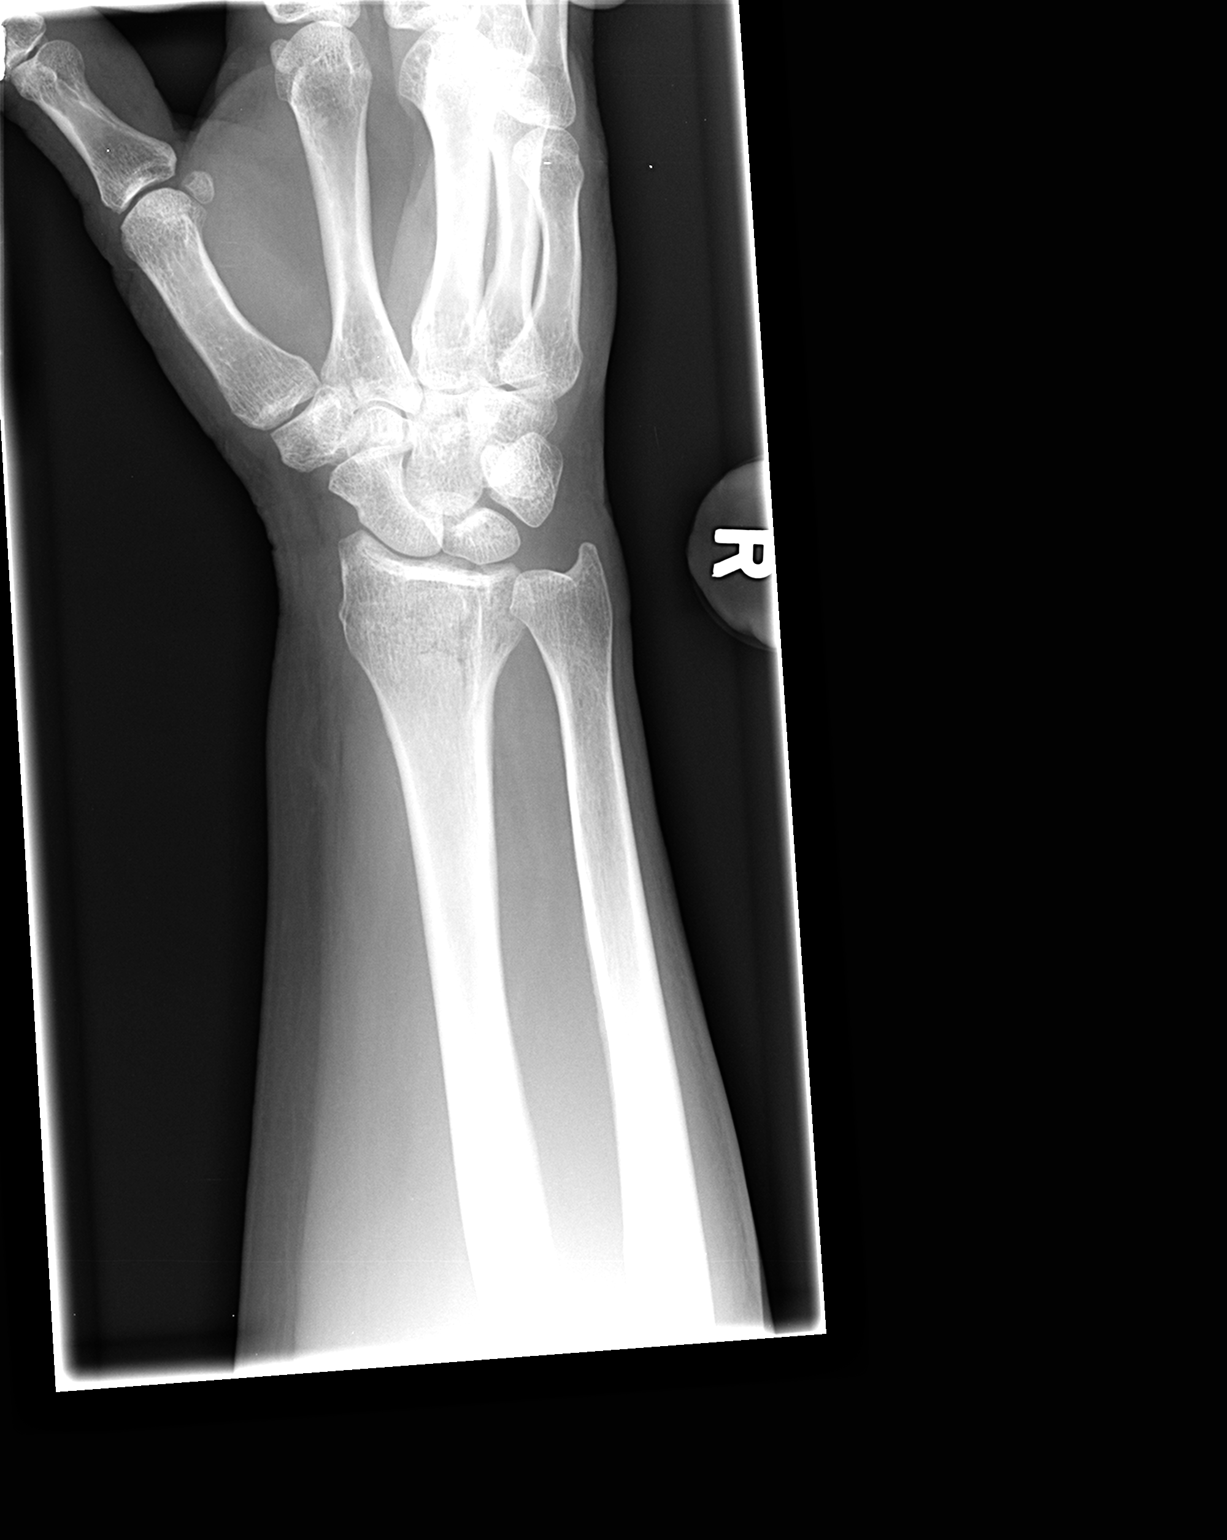

[view not recorded (3 of 3)]
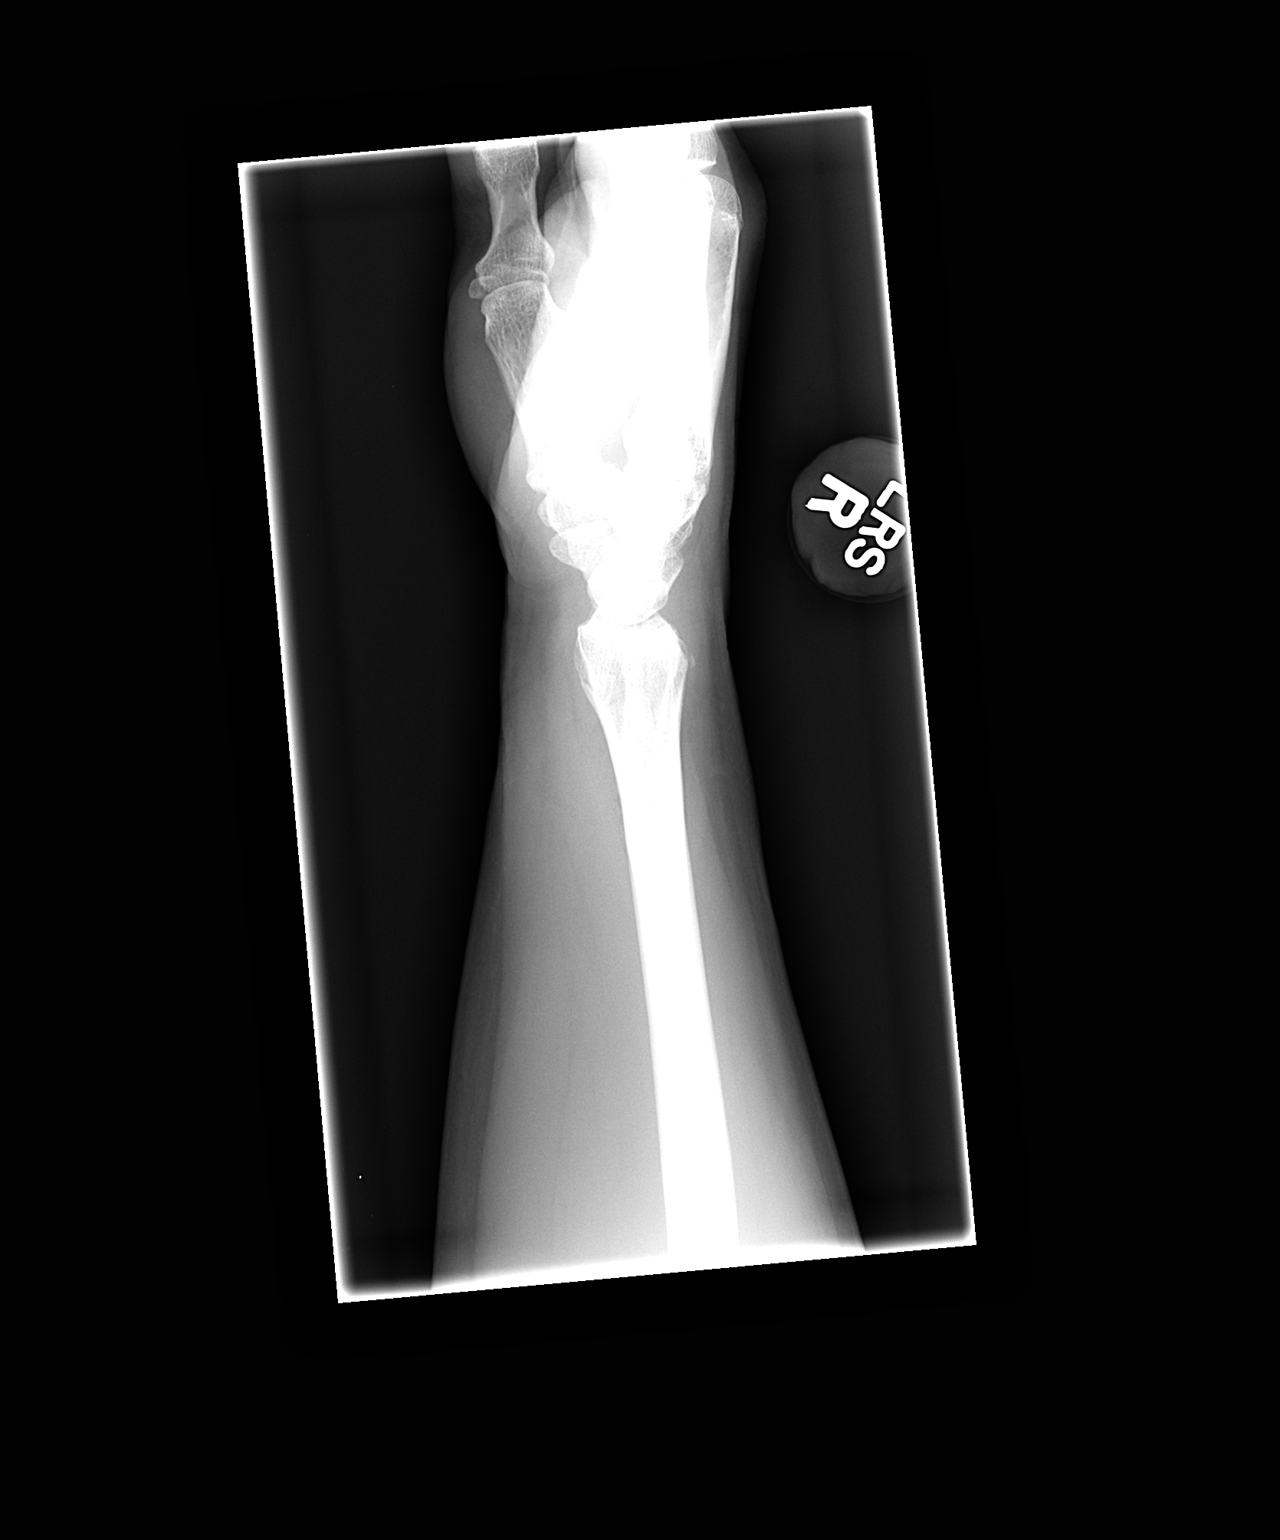

[3 of 3 positions shown; findings below may reference images not displayed]

FINDINGS: Comminuted intra-articular fracture of the distal radius
involving metaphysis and epiphysis is present.  Ulna is intact.
Scaphoid is intact.  Slight dorsal angulation of the distal radius
articular surface is noted.
IMPRESSION: Distal radius intra-articular fracture.

## 2011-11-17 ENCOUNTER — Other Ambulatory Visit: Payer: Self-pay | Admitting: Obstetrics and Gynecology

## 2011-11-17 DIAGNOSIS — Z1231 Encounter for screening mammogram for malignant neoplasm of breast: Secondary | ICD-10-CM

## 2011-12-28 ENCOUNTER — Ambulatory Visit
Admission: RE | Admit: 2011-12-28 | Discharge: 2011-12-28 | Disposition: A | Discharge status: Home / Self Care | Payer: BC Managed Care – PPO | Source: Ambulatory Visit | Attending: Obstetrics and Gynecology | Admitting: Obstetrics and Gynecology

## 2011-12-28 ENCOUNTER — Other Ambulatory Visit: Payer: Self-pay

## 2011-12-28 DIAGNOSIS — Z1231 Encounter for screening mammogram for malignant neoplasm of breast: Secondary | ICD-10-CM

## 2012-02-29 ENCOUNTER — Other Ambulatory Visit: Payer: Self-pay | Admitting: Dermatology

## 2012-03-21 ENCOUNTER — Encounter: Payer: Self-pay | Admitting: Internal Medicine

## 2012-03-21 ENCOUNTER — Ambulatory Visit (INDEPENDENT_AMBULATORY_CARE_PROVIDER_SITE_OTHER): Payer: BC Managed Care – PPO | Admitting: Internal Medicine

## 2012-03-21 VITALS — BP 120/78 | HR 64 | Temp 98.2°F | Resp 12 | Ht 61.5 in | Wt 163.6 lb

## 2012-03-21 DIAGNOSIS — M25551 Pain in right hip: Secondary | ICD-10-CM

## 2012-03-21 DIAGNOSIS — Z0000 Encounter for general adult medical examination without abnormal findings: Secondary | ICD-10-CM

## 2012-03-21 DIAGNOSIS — E785 Hyperlipidemia, unspecified: Secondary | ICD-10-CM

## 2012-03-21 DIAGNOSIS — M25559 Pain in unspecified hip: Secondary | ICD-10-CM

## 2012-03-21 LAB — HEPATIC FUNCTION PANEL
ALT: 20 U/L (ref 0–35)
AST: 23 U/L (ref 0–37)
Alkaline Phosphatase: 35 U/L — ABNORMAL LOW (ref 39–117)
Bilirubin, Direct: 0.1 mg/dL (ref 0.0–0.3)
Total Bilirubin: 0.5 mg/dL (ref 0.3–1.2)
Total Protein: 6.2 g/dL (ref 6.0–8.3)

## 2012-03-21 LAB — CBC WITH DIFFERENTIAL/PLATELET
Basophils Relative: 0.4 % (ref 0.0–3.0)
Eosinophils Relative: 5.6 % — ABNORMAL HIGH (ref 0.0–5.0)
MCV: 87.1 fl (ref 78.0–100.0)
Monocytes Absolute: 0.3 10*3/uL (ref 0.1–1.0)
Monocytes Relative: 7.2 % (ref 3.0–12.0)
Neutrophils Relative %: 61.1 % (ref 43.0–77.0)
RBC: 4.45 Mil/uL (ref 3.87–5.11)
WBC: 3.9 10*3/uL — ABNORMAL LOW (ref 4.5–10.5)

## 2012-03-21 LAB — BASIC METABOLIC PANEL
Chloride: 108 mEq/L (ref 96–112)
Creatinine, Ser: 0.6 mg/dL (ref 0.4–1.2)
GFR: 115.92 mL/min (ref >60.00)
Potassium: 4 mEq/L (ref 3.5–5.1)

## 2012-03-21 LAB — LIPID PANEL
LDL Cholesterol: 88 mg/dL (ref 0–99)
Total CHOL/HDL Ratio: 3
Triglycerides: 64 mg/dL (ref 0.0–149.0)

## 2012-03-21 LAB — TSH: TSH: 0.83 u[IU]/mL (ref 0.35–5.50)

## 2012-03-21 MED ORDER — PRAVASTATIN SODIUM 40 MG PO TABS: ORAL_TABLET | ORAL | Status: DC

## 2012-03-21 NOTE — Addendum Note (Signed)
Addended by: Maurice Small on: 03/21/2012 09:44 AM   Modules accepted: Orders

## 2012-03-21 NOTE — Progress Notes (Signed)
Subjective:    Patient ID: Yvonne Hall, female    DOB: 1961-02-24, 51 y.o.   MRN: [id number]  HPI  Mrs. Wolgamott is here for a physical;acute issues  Include chronic , recurrent R hip pain.  Onset:> 1 year Trigger/injury:no; she believes this is related to altered gait related to her plantar fasciitis Pain quality:aching , burning Pain severity:up to 6 Duration:hours, worse in RLDP or with prolonged sitting Radiation:occasionally to mid lateral /posterior thigh Treatment/response:NSAIDS / partial response        Review of Systems  Constitutional: no fever, chills, sweats,  weight loss Musculoskeletal:no  muscle cramps or pain; no  joint stiffness, redness, or swelling Skin:no rash, color change Neuro: no weakness; incontinence (stool/urine); numbness and tingling Heme:no lymphadenopathy; abnormal bruising or bleeding   She's noted a bruit in the right neck with certain head position change and also when prone in bed with her head rotated to the left      Objective:   Physical Exam Gen.:  well-nourished in appearance. Alert, appropriate and cooperative throughout exam. Head: Normocephalic without obvious abnormalities  Eyes: No corneal or conjunctival inflammation noted. Pupils equal round reactive to light and accommodation. Fundal exam is benign without hemorrhages, exudate, papilledema. Extraocular motion intact. Vision grossly normal with lenses. Ears: External  ear exam reveals no significant lesions or deformities. Canals clear .TMs normal. Hearing is grossly normal bilaterally. Tuning fork exam normal Nose: External nasal exam reveals no deformity or inflammation. Nasal mucosa are pink and moist. No lesions or exudates noted.  Mouth: Oral mucosa and oropharynx reveal no lesions or exudates. Teeth in good repair. Neck: No deformities, masses, or tenderness noted. Range of motion & Thyroid normal Lungs: Normal respiratory effort; chest expands symmetrically. Lungs are  clear to auscultation without rales, wheezes, or increased work of breathing. Heart: Normal rate and rhythm. Normal S1 and S2. No gallop, click, or rub. S4 w/o murmur. Abdomen: Bowel sounds normal; abdomen soft and nontender. No masses, organomegaly or hernias noted. Genitalia: Dr Aldona Bar , Gyn                                                                        Musculoskeletal/extremities: No deformity or scoliosis noted of  the thoracic or lumbar spine. No clubbing, cyanosis, edema, or deformity noted. Tone & strength  normal.Joints normal. Nail health  good. She is able to lie flat and sit up without help. There is no pain with full abduction and abduction of the hips. Vascular: Carotid, radial artery,  and  posterior tibial pulses are full and equal. Decreased DPP w/o ischemic changes. No carotid bruits present. Neurologic: Alert and oriented x3. Deep tendon reflexes symmetrical and normal.          Skin: Intact without suspicious lesions or rashes. Lymph: No cervical, axillary lymphadenopathy present. Psych: Mood and affect are normal. Normally interactive  Assessment & Plan:  #1 comprehensive physical exam; no acute findings #2 recurrent right hip pain with no neuromuscular deficit on exam. This may represent bursa-related pain and less likely a piriformis syndrome.  #3 positionally induced carotid bruit; as she may have some degenerative changes in her neck as etiology. There is no evidence of vascular compromise Plan: see Orders

## 2012-03-21 NOTE — Patient Instructions (Addendum)
Preventive Health Care: Exercise  30-45  minutes a day, 3-4 days a week. Walking is especially valuable in preventing Osteoporosis. Eat a low-fat diet with lots of fruits and vegetables, up to 7-9 servings per day.  Consume less than 30 grams of sugar per day from foods & drinks with High Fructose Corn Syrup as # 1,2,3 or #4 on label.   Use arthritis strength Tylenol at bedtime as needed for the hip pain. See Dr. Darrick Penna if symptoms persist  If you activate My Chart; the results can be released to you as soon as they populate from the lab. If you choose not to use this program; the labs have to be reviewed, copied & mailed causing a delay in getting the results to you.

## 2012-10-31 ENCOUNTER — Other Ambulatory Visit: Payer: Self-pay

## 2012-10-31 DIAGNOSIS — Z1231 Encounter for screening mammogram for malignant neoplasm of breast: Secondary | ICD-10-CM

## 2013-01-02 ENCOUNTER — Ambulatory Visit
Admission: RE | Admit: 2013-01-02 | Discharge: 2013-01-02 | Disposition: A | Discharge status: Home / Self Care | Payer: BC Managed Care – PPO | Source: Ambulatory Visit

## 2013-01-02 ENCOUNTER — Other Ambulatory Visit: Payer: Self-pay | Admitting: Obstetrics & Gynecology

## 2013-01-02 DIAGNOSIS — Z1231 Encounter for screening mammogram for malignant neoplasm of breast: Secondary | ICD-10-CM

## 2013-03-20 ENCOUNTER — Telehealth: Payer: Self-pay

## 2013-03-20 NOTE — Telephone Encounter (Signed)
Medication and allergies: reviewed and updated  90 day supply/mail order: na Local pharmacy: [facility] [place]   Immunizations due:  UTD  A/P:   No changes to FH or PSH Flu vaccine at [facility]--01/02/2013--neg Pap--01/02/2013--neg--Dr Wein [facility]--09/2011-- adenomatous polyp--next 09/2016  To Discuss with Provider: Can you refill diethylpropion? She got from Dr Aldona Bar and needs for one more month

## 2013-03-21 ENCOUNTER — Encounter: Payer: Self-pay | Admitting: Internal Medicine

## 2013-03-21 ENCOUNTER — Ambulatory Visit (INDEPENDENT_AMBULATORY_CARE_PROVIDER_SITE_OTHER): Payer: BC Managed Care – PPO | Admitting: Internal Medicine

## 2013-03-21 VITALS — BP 126/79 | HR 81 | Temp 98.6°F | Ht 61.25 in | Wt 154.2 lb

## 2013-03-21 DIAGNOSIS — R0989 Other specified symptoms and signs involving the circulatory and respiratory systems: Secondary | ICD-10-CM | POA: Insufficient documentation

## 2013-03-21 DIAGNOSIS — Z86007 Personal history of in-situ neoplasm of skin: Secondary | ICD-10-CM | POA: Insufficient documentation

## 2013-03-21 DIAGNOSIS — Z0000 Encounter for general adult medical examination without abnormal findings: Secondary | ICD-10-CM

## 2013-03-21 DIAGNOSIS — Z780 Asymptomatic menopausal state: Secondary | ICD-10-CM

## 2013-03-21 DIAGNOSIS — E782 Mixed hyperlipidemia: Secondary | ICD-10-CM

## 2013-03-21 DIAGNOSIS — Z1331 Encounter for screening for depression: Secondary | ICD-10-CM

## 2013-03-21 DIAGNOSIS — C4492 Squamous cell carcinoma of skin, unspecified: Secondary | ICD-10-CM | POA: Insufficient documentation

## 2013-03-21 DIAGNOSIS — Z8781 Personal history of (healed) traumatic fracture: Secondary | ICD-10-CM

## 2013-03-21 LAB — BASIC METABOLIC PANEL
BUN: 10 mg/dL (ref 6–23)
CO2: 27 mEq/L (ref 19–32)
Chloride: 104 mEq/L (ref 96–112)
Potassium: 3.9 mEq/L (ref 3.5–5.1)

## 2013-03-21 LAB — HEPATIC FUNCTION PANEL
ALT: 20 U/L (ref 0–35)
Albumin: 4.3 g/dL (ref 3.5–5.2)
Total Protein: 6.7 g/dL (ref 6.0–8.3)

## 2013-03-21 LAB — CBC WITH DIFFERENTIAL/PLATELET
Basophils Relative: 0.4 % (ref 0.0–3.0)
Eosinophils Absolute: 0.2 10*3/uL (ref 0.0–0.7)
Eosinophils Relative: 3.9 % (ref 0.0–5.0)
HCT: 36.5 % (ref 36.0–46.0)
Lymphs Abs: 1.2 10*3/uL (ref 0.7–4.0)
MCHC: 33.2 g/dL (ref 30.0–36.0)
MCV: 79.9 fl (ref 78.0–100.0)
Monocytes Absolute: 0.3 10*3/uL (ref 0.1–1.0)
RBC: 4.57 Mil/uL (ref 3.87–5.11)
WBC: 4.2 10*3/uL — ABNORMAL LOW (ref 4.5–10.5)

## 2013-03-21 LAB — LIPID PANEL
Cholesterol: 189 mg/dL (ref 0–200)
Triglycerides: 76 mg/dL (ref 0.0–149.0)

## 2013-03-21 MED ORDER — PRAVASTATIN SODIUM 20 MG PO TABS: 20.0000 mg | ORAL_TABLET | Freq: Every day | ORAL | Status: DC

## 2013-03-21 NOTE — Patient Instructions (Signed)
Your next office appointment will be determined based upon review of your pending labs & BMD. Those instructions will be transmitted to you through the mail .

## 2013-03-21 NOTE — Progress Notes (Signed)
Pre visit review using our clinic review tool, if applicable. No additional management support is needed unless otherwise documented below in the visit note. 

## 2013-03-21 NOTE — Progress Notes (Signed)
  Subjective:    Patient ID: Yvonne Hall, female    DOB: 1960-06-14, 52 y.o.   MRN: [id number]  HPI She is here for a physical;acute issues include chronic R tinnitus over 18 months     Review of Systems A heart healthy diet is followed; exercise encompasses 30-50 minutes 4-5  times per week as walking without symptoms.  Family history is positive for premature coronary disease in PGF. Advanced cholesterol testing reveals  LDL goal is less than 110 ; ideally < 80 . There is medication compliance with the statin.  Low dose ASA taken 3 X/ week. Specifically denied are  chest pain, palpitations, dyspnea, or claudication.  Significant abdominal symptoms, memory deficit, or myalgias not present.     Objective:   Physical Exam  Gen.: Healthy and well-nourished in appearance. Alert, appropriate and cooperative throughout exam.Appears younger than stated age  Head: Normocephalic without obvious abnormalities  Eyes: No corneal or conjunctival inflammation noted. Pupils equal round reactive to light and accommodation. Extraocular motion intact.  Ears: External  ear exam reveals no significant lesions or deformities. Canals clear .TMs normal.  Nose: External nasal exam reveals no deformity or inflammation. Nasal mucosa are pink and moist. No lesions or exudates noted.  Mouth: Oral mucosa and oropharynx reveal no lesions or exudates. Teeth in good repair. Neck: No deformities, masses, or tenderness noted. Range of motion & Thyroid normal. Lungs: Normal respiratory effort; chest expands symmetrically. Lungs are clear to auscultation without rales, wheezes, or increased work of breathing. Heart: Normal rate and rhythm. Normal S1 and S2. No gallop, click, or rub. Grade 1/2 over 6 systolic murmur. Abdomen: Bowel sounds normal; abdomen soft and nontender. No masses, organomegaly or hernias noted. Genitalia:  as per Gyn                                  Musculoskeletal/extremities: No deformity or  scoliosis noted of  the thoracic or lumbar spine.  No clubbing, cyanosis, edema, or significant extremity  deformity noted. Range of motion normal .Tone & strength normal. Hand joints normal . Fingernail  health good. Able to lie down & sit up w/o help. Negative SLR bilaterally Vascular: Carotid, radial artery, dorsalis pedis and  posterior tibial pulses are full and equal. No bruits present. Neurologic: Alert and oriented x3. Deep tendon reflexes symmetrical and normal.  Slight nystagmus with EOM      Skin: Intact without suspicious lesions or rashes. Lymph: No cervical, axillary lymphadenopathy present. Psych: Mood and affect are normal. Normally interactive                                                                                        Assessment & Plan:  #1 comprehensive physical exam; no acute findings #2 positional carotid bruit  Plan: see Orders  & Recommendations

## 2013-03-22 ENCOUNTER — Encounter: Payer: Self-pay | Admitting: *Deleted

## 2013-03-22 NOTE — Progress Notes (Signed)
Letter mailed to patient.

## 2013-03-31 ENCOUNTER — Ambulatory Visit (INDEPENDENT_AMBULATORY_CARE_PROVIDER_SITE_OTHER)
Admission: RE | Admit: 2013-03-31 | Discharge: 2013-03-31 | Disposition: A | Discharge status: Home / Self Care | Payer: BC Managed Care – PPO | Source: Ambulatory Visit | Attending: Internal Medicine | Admitting: Internal Medicine

## 2013-03-31 DIAGNOSIS — Z780 Asymptomatic menopausal state: Secondary | ICD-10-CM

## 2013-03-31 DIAGNOSIS — Z8781 Personal history of (healed) traumatic fracture: Secondary | ICD-10-CM

## 2013-04-04 ENCOUNTER — Encounter: Payer: Self-pay | Admitting: Internal Medicine

## 2013-04-04 DIAGNOSIS — M810 Age-related osteoporosis without current pathological fracture: Secondary | ICD-10-CM | POA: Insufficient documentation

## 2013-05-11 HISTORY — PX: OTHER SURGICAL HISTORY: SHX169

## 2013-06-05 ENCOUNTER — Encounter: Payer: Self-pay | Admitting: Internal Medicine

## 2013-11-09 ENCOUNTER — Other Ambulatory Visit: Payer: Self-pay | Admitting: Dermatology

## 2013-11-21 ENCOUNTER — Other Ambulatory Visit: Payer: Self-pay

## 2013-11-21 DIAGNOSIS — Z1231 Encounter for screening mammogram for malignant neoplasm of breast: Secondary | ICD-10-CM

## 2013-12-25 ENCOUNTER — Other Ambulatory Visit: Payer: Self-pay | Admitting: Dermatology

## 2014-01-08 ENCOUNTER — Other Ambulatory Visit: Payer: Self-pay | Admitting: Obstetrics & Gynecology

## 2014-01-08 ENCOUNTER — Ambulatory Visit
Admission: RE | Admit: 2014-01-08 | Discharge: 2014-01-08 | Disposition: A | Discharge status: Home / Self Care | Payer: BC Managed Care – PPO | Source: Ambulatory Visit

## 2014-01-08 DIAGNOSIS — Z1231 Encounter for screening mammogram for malignant neoplasm of breast: Secondary | ICD-10-CM

## 2014-01-09 LAB — CYTOLOGY - PAP

## 2014-03-21 ENCOUNTER — Encounter: Payer: Self-pay | Admitting: Internal Medicine

## 2014-03-21 ENCOUNTER — Ambulatory Visit (INDEPENDENT_AMBULATORY_CARE_PROVIDER_SITE_OTHER): Payer: BLUE CROSS/BLUE SHIELD | Admitting: Internal Medicine

## 2014-03-21 ENCOUNTER — Other Ambulatory Visit (INDEPENDENT_AMBULATORY_CARE_PROVIDER_SITE_OTHER): Payer: BLUE CROSS/BLUE SHIELD

## 2014-03-21 ENCOUNTER — Other Ambulatory Visit: Payer: Self-pay | Admitting: Internal Medicine

## 2014-03-21 VITALS — BP 122/88 | HR 67 | Temp 98.4°F | Resp 12 | Ht 61.0 in | Wt 168.5 lb

## 2014-03-21 DIAGNOSIS — Z13820 Encounter for screening for osteoporosis: Secondary | ICD-10-CM

## 2014-03-21 DIAGNOSIS — E782 Mixed hyperlipidemia: Secondary | ICD-10-CM

## 2014-03-21 DIAGNOSIS — Z0000 Encounter for general adult medical examination without abnormal findings: Secondary | ICD-10-CM

## 2014-03-21 DIAGNOSIS — M25571 Pain in right ankle and joints of right foot: Secondary | ICD-10-CM

## 2014-03-21 DIAGNOSIS — E785 Hyperlipidemia, unspecified: Secondary | ICD-10-CM

## 2014-03-21 DIAGNOSIS — M810 Age-related osteoporosis without current pathological fracture: Secondary | ICD-10-CM

## 2014-03-21 LAB — HEPATIC FUNCTION PANEL
ALBUMIN: 3.6 g/dL (ref 3.5–5.2)
ALT: 21 U/L (ref 0–35)
AST: 21 U/L (ref 0–37)
Alkaline Phosphatase: 39 U/L (ref 39–117)
BILIRUBIN DIRECT: 0.1 mg/dL (ref 0.0–0.3)
TOTAL PROTEIN: 6.8 g/dL (ref 6.0–8.3)
Total Bilirubin: 0.5 mg/dL (ref 0.2–1.2)

## 2014-03-21 LAB — BASIC METABOLIC PANEL
BUN: 13 mg/dL (ref 6–23)
CHLORIDE: 105 meq/L (ref 96–112)
CO2: 23 meq/L (ref 19–32)
CREATININE: 0.6 mg/dL (ref 0.4–1.2)
Calcium: 9.1 mg/dL (ref 8.4–10.5)
GFR: 110.63 mL/min (ref >60.00)
GLUCOSE: 94 mg/dL (ref 70–99)
Potassium: 4.1 mEq/L (ref 3.5–5.1)
Sodium: 140 mEq/L (ref 135–145)

## 2014-03-21 LAB — CBC WITH DIFFERENTIAL/PLATELET
BASOS PCT: 0.5 % (ref 0.0–3.0)
Basophils Absolute: 0 10*3/uL (ref 0.0–0.1)
EOS ABS: 0.2 10*3/uL (ref 0.0–0.7)
Eosinophils Relative: 5.6 % — ABNORMAL HIGH (ref 0.0–5.0)
HCT: 40 % (ref 36.0–46.0)
Hemoglobin: 12.9 g/dL (ref 12.0–15.0)
Lymphocytes Relative: 27.6 % (ref 12.0–46.0)
Lymphs Abs: 1.2 10*3/uL (ref 0.7–4.0)
MCHC: 32.3 g/dL (ref 30.0–36.0)
MCV: 79.7 fl (ref 78.0–100.0)
Monocytes Absolute: 0.3 10*3/uL (ref 0.1–1.0)
Monocytes Relative: 7.6 % (ref 3.0–12.0)
NEUTROS ABS: 2.6 10*3/uL (ref 1.4–7.7)
Neutrophils Relative %: 58.7 % (ref 43.0–77.0)
Platelets: 171 10*3/uL (ref 150.0–400.0)
RBC: 5.02 Mil/uL (ref 3.87–5.11)
RDW: 15.8 % — ABNORMAL HIGH (ref 11.5–15.5)
WBC: 4.4 10*3/uL (ref 4.0–10.5)

## 2014-03-21 LAB — URIC ACID: URIC ACID, SERUM: 3.6 mg/dL (ref 2.4–7.0)

## 2014-03-21 LAB — TSH: TSH: 1.37 u[IU]/mL (ref 0.35–4.50)

## 2014-03-21 LAB — VITAMIN D 25 HYDROXY (VIT D DEFICIENCY, FRACTURES): VITD: 38 ng/mL (ref 30.00–100.00)

## 2014-03-21 MED ORDER — PRAVASTATIN SODIUM 20 MG PO TABS: 20.0000 mg | ORAL_TABLET | Freq: Every day | ORAL | Status: DC

## 2014-03-21 MED ORDER — ALENDRONATE SODIUM 70 MG PO TABS: 70.0000 mg | ORAL_TABLET | ORAL | Status: DC

## 2014-03-21 NOTE — Progress Notes (Signed)
Subjective:    Patient ID: Yvonne Hall, female    DOB: 06-May-1961, 53 y.o.   MRN: [id number]  HPI  She is here for a physical;acute issues include plantar fasciitis & pain @ R great toe joint.  She is on a modified heart healthy diet. Her foot issues limit exercise to 1-2 times per week. She does describe some exertional dyspnea but no other cardiopulmonary symptoms  Her bone density in November 2014 did reveal osteoporosis with a T score of -3.0 at the spine. She has never taken bone building therapy. She has minor intermittent dyspepsia but no significant GI symptoms otherwise.  Her colonoscopy is up-to-date. She had polyps on 2 occasions and is due for follow-up 2018.     Review of Systems   Chest pain, palpitations, tachycardia, paroxysmal nocturnal dyspnea, claudication or edema are absent.  Unexplained weight loss, abdominal pain, dysphagia, melena, rectal bleeding, or persistently small caliber stools are denied.     Objective:   Physical Exam Gen.: Healthy and well-nourished in appearance. Alert, appropriate and cooperative throughout exam. Appears younger than stated age  Head: Normocephalic without obvious abnormalities; hair fine w/o alopecia  Eyes: No corneal or conjunctival inflammation noted. Pupils equal round reactive to light and accommodation. Extraocular motion intact.  Ears: External  ear exam reveals no significant lesions or deformities. Canals clear .TMs normal. Hearing is grossly normal bilaterally. Nose: External nasal exam reveals no deformity or inflammation. Nasal mucosa are pink and moist. No lesions or exudates noted.   Mouth: Oral mucosa and oropharynx reveal no lesions or exudates. Teeth in good repair. Neck: No deformities, masses, or tenderness noted. Range of motion & Thyroid normal. Lungs: Normal respiratory effort; chest expands symmetrically. Lungs are clear to auscultation without rales, wheezes, or increased work of breathing. Heart:  Normal rate and rhythm. Normal S1 and S2. No gallop, click, or rub. No murmur. Abdomen: Bowel sounds normal; abdomen soft and nontender. No masses, organomegaly or hernias noted. Genitalia: as per Gyn                                  Musculoskeletal/extremities: No deformity or scoliosis noted of  the thoracic or lumbar spine.  No clubbing, cyanosis, edema, or significant extremity  deformity noted. Range of motion normal .Tone & strength normal. Hand joints normal.Tenderness of R great toe @ base Fingernail  health good. Able to lie down & sit up w/o help. Negative SLR bilaterally Vascular: Carotid, radial artery, dorsalis pedis and  posterior tibial pulses are full and equal. No bruits present. Neurologic: Alert and oriented x3. Deep tendon reflexes symmetrical and normal.  Gait normal .       Skin: Intact without suspicious lesions or rashes. Lymph: No cervical, axillary lymphadenopathy present. Psych: Mood and affect are normal. Normally interactive                                                                                        Assessment & Plan:  #1 comprehensive physical exam; no acute findings #2 toe pain #3 Osteoporosis  Plan: see  Orders  & Recommendations

## 2014-03-21 NOTE — Progress Notes (Signed)
Pre visit review using our clinic review tool, if applicable. No additional management support is needed unless otherwise documented below in the visit note. 

## 2014-03-21 NOTE — Patient Instructions (Signed)
Your next office appointment will be determined based upon review of your pending labs . Those instructions will be transmitted to you through My Chart .  Followup as needed for your acute issue. Please report any significant change in your symptoms. 

## 2014-03-23 ENCOUNTER — Encounter: Payer: Self-pay | Admitting: Internal Medicine

## 2014-03-23 LAB — NMR LIPOPROFILE WITH LIPIDS
CHOLESTEROL, TOTAL: 192 mg/dL (ref 100–199)
HDL PARTICLE NUMBER: 39.6 umol/L (ref >=30.5)
HDL Size: 8.7 nm — ABNORMAL LOW (ref >=9.2)
HDL-C: 55 mg/dL (ref >39)
LDL CALC: 108 mg/dL — AB (ref 0–99)
LDL Particle Number: 1263 nmol/L — ABNORMAL HIGH (ref <1000)
LDL Size: 20.8 nm (ref >=20.8)
LP-IR SCORE: 39 (ref <=45)
Large HDL-P: 5.3 umol/L (ref >=4.8)
Large VLDL-P: <0.8 nmol/L (ref <=2.7)
SMALL LDL PARTICLE NUMBER: 374 nmol/L (ref <=527)
TRIGLYCERIDES: 147 mg/dL (ref 0–149)
VLDL Size: 41.2 nm (ref <=46.6)

## 2014-05-11 HISTORY — PX: VITRECTOMY: SHX106

## 2014-05-11 HISTORY — PX: CATARACT EXTRACTION: SUR2

## 2014-08-22 ENCOUNTER — Other Ambulatory Visit: Payer: Self-pay | Admitting: Dermatology

## 2014-10-03 ENCOUNTER — Ambulatory Visit (INDEPENDENT_AMBULATORY_CARE_PROVIDER_SITE_OTHER): Payer: BLUE CROSS/BLUE SHIELD | Admitting: Sports Medicine

## 2014-10-03 ENCOUNTER — Encounter: Payer: Self-pay | Admitting: Sports Medicine

## 2014-10-03 VITALS — BP 134/62 | HR 87 | Ht 61.0 in | Wt 168.0 lb

## 2014-10-03 DIAGNOSIS — M25551 Pain in right hip: Secondary | ICD-10-CM | POA: Diagnosis not present

## 2014-10-03 MED ORDER — NITROGLYCERIN 0.2 MG/HR TD PT24: 0.2000 mg | MEDICATED_PATCH | Freq: Every day | TRANSDERMAL | Status: DC

## 2014-10-03 NOTE — Patient Instructions (Signed)

## 2014-10-03 NOTE — Progress Notes (Signed)
Patient ID: Yvonne Hall, female   DOB: 10/15/1960, 54 y.o.   MRN: [id number]  Rt hip pain for past few years Worse this year Cam walker on left also caused some pain on RT about 3 years ago  Recently Dx with osteoporosis In red zone and started on fosamax  In 2013 she had some glut med issues  Now can't sleep on RT side or sitting hurts  walks less 2/2 pain Hurts and pain  Works in clinic setting Uses shoes with inserts which we gave her for PF  Exam NAD BP 134/62 mmHg  Pulse 87  Ht 5\' 1"  (1.549 m)  Wt 168 lb (76.204 kg)  BMI 31.76 kg/m2  LMP 08/03/2011  Rom of hips is wnl bilat Strength is week on glut med Rt and Left Weak on Glut Max RT TTP over GRT troch TTP over piriformis  Ultrasound right lateral hip Bilateral hip is visualized in the iliotibial band is intact There is some hypoechoic fluid below the iliotibial band just below the greater trochanter Hypoechoic change is noted in the gluteus medius tendon 2 cm above the greater trochanter Hypoechoic change is noted with the piriformis muscle distally as it attaches to the greater trochanter Other tendons and muscles looked normal

## 2014-10-03 NOTE — Assessment & Plan Note (Signed)
Ultrasound this appears to involve the piriformis tendon primarily with some involvement of the gluteus medius tendon  There is not a specific tear  She does not show signs of osteoarthritis of the hip  Tried a nitroglycerin protocol  Home exercises with hip abduction and rotation  Referral to physical therapy to work on core strength and hip abduction  Return in 4-6 weeks and if she is not improved I would consider corticosteroid injection

## 2014-10-09 ENCOUNTER — Ambulatory Visit: Payer: Self-pay | Admitting: Sports Medicine

## 2014-10-22 ENCOUNTER — Ambulatory Visit: Payer: BLUE CROSS/BLUE SHIELD | Attending: Sports Medicine | Admitting: Physical Therapy

## 2014-10-22 DIAGNOSIS — G5701 Lesion of sciatic nerve, right lower limb: Secondary | ICD-10-CM | POA: Diagnosis present

## 2014-10-22 NOTE — Therapy (Signed)
[facility] [place], [place], [address] Phone: [phone]   Fax:  [phone]  Physical Therapy Evaluation  Patient Details  Name: Yvonne Hall MRN: [id number] Date of Birth: 12/09/1960 Referring Provider:  Stefanie Libel, MD  Encounter Date: 10/22/2014      PT End of Session - 10/22/14 1025    Visit Number 1   Number of Visits 12   Date for PT Re-Evaluation 12/03/14   PT Start Time 0910   PT Stop Time 0947   PT Time Calculation (min) 37 min   Activity Tolerance Patient tolerated treatment well   Behavior During Therapy [facility] for tasks assessed/performed      Past Medical History  Diagnosis Date  . Hyperlipidemia   . FH: colonic polyps   . Plantar fasciitis 2012    partial tear on MRI ; Dr Steffanie Rainwater & Dr Stefanie Libel  . PPD positive, treated 1994    6 months INH  . CTS (carpal tunnel syndrome)     overnight splints  . Squamous cell carcinoma 6 & 10 / 2013    cryotherapy & topical cream ; Dr Tonia Brooms  . Osteoporosis     Past Surgical History  Procedure Laterality Date  . Tubal ligation    . G3  p2    . Cesarean section      x2  . Wide resection      R shoulder pre malignant  lesion; Dr Tonia Brooms  . Radial keratotomy  1998  . Colonoscopy w/ polypectomy  2003 & 2013    negative 2008,  Dr Fuller Plan, due 2018  . Retina pexy  2015    for pin hole retinla bleed; Dr Gershon Crane    There were no vitals filed for this visit.  Visit Diagnosis:  Piriformis syndrome, right - Plan: PT plan of care cert/re-cert      Subjective Assessment - 10/22/14 1003    Subjective The right buttock pain will not go away.            [facility] PT Assessment - 10/22/14 0001    Assessment   Medical Diagnosis Pain in joint, pelvic region and thigh.   Onset Date/Surgical Date --  3 years.   Precautions   Precautions --  OP.   Restrictions   Weight Bearing Restrictions No   Balance Screen   Has the patient fallen in the past 6  months No   Has the patient had a decrease in activity level because of a fear of falling?  No   Is the patient reluctant to leave their home because of a fear of falling?  No   Home Ecologist residence   Prior Function   Level of Independence Independent   Posture/Postural Control   Posture/Postural Control No significant limitations   ROM / Strength   AROM / PROM / Strength --  Normal right LE ROM.   Palpation   Palpation comment --  Tender over distribution of right piriformis muscle.   Special Tests    Special Tests Lumbar;Sacrolliac Tests;Leg LengthTest   Lumbar Tests --  Diminished LE DTR's.  Negative SLR testing.   Sacroiliac Tests  --  Negative FABER test.   Leg length test  --  Equal leg lengths.                   [facility] Adult PT Treatment/Exercise - 10/22/14 0001    Modalities   Modalities Ultrasound  Ultrasound   Ultrasound Location Right Piriformis   Ultrasound Parameters 1.50 W/CM2 x 8 minutes.   Ultrasound Goals Pain                     PT Long Term Goals - 10/22/14 1026    PT LONG TERM GOAL #1   Title Ind with HEP.   Time 6   Period Weeks   Status New   PT LONG TERM GOAL #2   Title Perform ADL's with pain not > 3/10.   Time 6   Period Weeks   Status New   PT LONG TERM GOAL #3   Title Sit 30 minutes with pain not > 3/10.   Time 6   Period Weeks   Status New               Plan - 10/22/14 1005    Clinical Impression Statement The patient reports a three year h/o right buttock pain.  She relates this to being in a CAM boot/walker for plantar fasciitis that changed the way she walked.  Her pain-level today is a 6/10 but can go higher with prolonged sitting.  Standing and walking decrease pain.  An ultrasound of the patient's right hip ruledout joint involvement.   Pt will benefit from skilled therapeutic intervention in order to improve on the following deficits Pain;Decreased activity  tolerance   Rehab Potential Excellent   PT Frequency 2x / week   PT Duration 6 weeks   PT Treatment/Interventions Moist Heat;Electrical Stimulation;Ultrasound;Therapeutic exercise;Passive range of motion;Manual techniques   PT Next Visit Plan Left sdly position---combo e'stim/U/S to right piriformis amd STW/M release techniques.   Consulted and Agree with Plan of Care Patient         Problem List Patient Active Problem List   Diagnosis Date Noted  . Osteoporosis 04/04/2013  . 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 Right carotid bruit 03/21/2013  . Plantar fasciitis 06/01/2011  . HIP PAIN, RIGHT 03/21/2010  . 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 COLLES' FRACTURE 12/02/2009  . HYPOGLYCEMIA, UNSPECIFIED 03/22/2007  . HYPERLIPIDEMIA 03/22/2007  . Carpal tunnel syndrome 03/22/2007  . 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 History of colonic polyps 03/22/2007    APPLE[place], [place]  MPT  10/22/2014, 10:33 AM  [facility] [address] [place], [place], [address] Phone: [phone]   Fax:  [phone]

## 2014-10-29 ENCOUNTER — Ambulatory Visit: Payer: BLUE CROSS/BLUE SHIELD | Admitting: Physical Therapy

## 2014-10-29 ENCOUNTER — Encounter: Payer: BLUE CROSS/BLUE SHIELD | Admitting: Physical Therapy

## 2014-10-30 ENCOUNTER — Ambulatory Visit: Payer: BLUE CROSS/BLUE SHIELD | Admitting: Physical Therapy

## 2014-10-30 DIAGNOSIS — G5701 Lesion of sciatic nerve, right lower limb: Secondary | ICD-10-CM

## 2014-10-30 NOTE — Patient Instructions (Signed)
Stretching: Piriformis   Cross left leg over other thigh and place elbow over outside of knee. Gently stretch buttock muscles by pushing bent knee across body. Hold __30__ seconds. Repeat __3__ times per set. Do __1__ sets per session. Do __3__ sessions per day.  http://orth.exer.us/650   Copyright  VHI. All rights reserved.

## 2014-10-30 NOTE — Therapy (Signed)
[facility] [place], [place], [address] Phone: [phone]   Fax:  [phone]  Physical Therapy Treatment  Patient Details  Name: Yvonne Hall MRN: [id number] Date of Birth: 08/11/1960 Referring Provider:  Hendricks Limes, MD  Encounter Date: 10/30/2014      PT End of Session - 10/30/14 0733    Visit Number 2   Number of Visits 12   Date for PT Re-Evaluation 12/03/14   PT Start Time 0731   PT Stop Time 0816   PT Time Calculation (min) 45 min   Activity Tolerance Patient tolerated treatment well   Behavior During Therapy [facility] for tasks assessed/performed      Past Medical History  Diagnosis Date  . Hyperlipidemia   . FH: colonic polyps   . Plantar fasciitis 2012    partial tear on MRI ; Dr Steffanie Rainwater & Dr Stefanie Libel  . PPD positive, treated 1994    6 months INH  . CTS (carpal tunnel syndrome)     overnight splints  . Squamous cell carcinoma 6 & 10 / 2013    cryotherapy & topical cream ; Dr Tonia Brooms  . Osteoporosis     Past Surgical History  Procedure Laterality Date  . Tubal ligation    . G3  p2    . Cesarean section      x2  . Wide resection      R shoulder pre malignant  lesion; Dr Tonia Brooms  . Radial keratotomy  1998  . Colonoscopy w/ polypectomy  2003 & 2013    negative 2008,  Dr Fuller Plan, due 2018  . Retina pexy  2015    for pin hole retinla bleed; Dr Gershon Crane    There were no vitals filed for this visit.  Visit Diagnosis:  Piriformis syndrome, right      Subjective Assessment - 10/30/14 0732    Subjective Constantly hurts and prolonged sitting exaggerates pain. Continues to report compliance with exercises Dr. Oneida Alar gave her as well as walking when she can. Reports continued use of Nitroglycerin patches given by Dr. Oneida Alar that are cut into quarters.   Limitations Sitting   How long can you sit comfortably? 20-30 minutes.   Patient Stated Goals Get out of pain.   Currently in Pain? Yes    Pain Score 4    Pain Location Hip   Pain Orientation Right   Pain Descriptors / Indicators Aching   Pain Frequency Constant            [facility] PT Assessment - 10/30/14 0001    Assessment   Medical Diagnosis Pain in joint, pelvic region and thigh.                     [facility] Adult PT Treatment/Exercise - 10/30/14 0001    Modalities   Modalities Ultrasound   Ultrasound   Ultrasound Location Combo to R piriformis in L sidelying   Ultrasound Parameters 1.5 w/cm2, 100%   Ultrasound Goals Pain   Manual Therapy   Manual Therapy Myofascial release   Myofascial Release STW/ TPR to R piriformis in L sidelying to decrease tightness and pain                     PT Long Term Goals - 10/30/14 0827    PT LONG TERM GOAL #1   Title Ind with HEP.   Time 6   Period Weeks   Status On-going  PT LONG TERM GOAL #2   Title Perform ADL's with pain not > 3/10.   Time 6   Period Weeks   Status On-going   PT LONG TERM GOAL #3   Title Sit 30 minutes with pain not > 3/10.   Time 6   Period Weeks   Status On-going               Plan - 10/30/14 3845    Clinical Impression Statement Patient tolerated treatment well today with modalities and STW. Moderate tightness noted in the R piriformis during STW with nodules of tightness noted throughout the muscle. Patient was able to tolerate moderate pressure during STW and expressed soreness to touch in some regions of the muscle. Accepted printed HEP of the piriformis muscle that could be completed at her desk in sitting at work. Normal response to modalties noted following removal of the modalties. Experienced "feeling better" and 1/10 pain following treatment. Biofreeze applied to R piriformis following treatment.   Pt will benefit from skilled therapeutic intervention in order to improve on the following deficits Pain;Decreased activity tolerance   Rehab Potential Excellent   PT Frequency 2x / week   PT Duration 6 weeks    PT Treatment/Interventions Moist Heat;Electrical Stimulation;Ultrasound;Therapeutic exercise;Passive range of motion;Manual techniques   PT Next Visit Plan Continue per MPT POC.   Consulted and Agree with Plan of Care Patient        Problem List Patient Active Problem List   Diagnosis Date Noted  . Osteoporosis 04/04/2013  . 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 Right carotid bruit 03/21/2013  . Plantar fasciitis 06/01/2011  . HIP PAIN, RIGHT 03/21/2010  . 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 COLLES' FRACTURE 12/02/2009  . HYPOGLYCEMIA, UNSPECIFIED 03/22/2007  . HYPERLIPIDEMIA 03/22/2007  . Carpal tunnel syndrome 03/22/2007  . 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 History of colonic polyps 03/22/2007    Wynelle Fanny, PTA 10/30/2014, 8:31 AM  [facility] [address] [place], [place], [address] Phone: [phone]   Fax:  [phone]

## 2014-11-02 ENCOUNTER — Encounter: Payer: Self-pay | Admitting: Physical Therapy

## 2014-11-02 ENCOUNTER — Ambulatory Visit: Payer: BLUE CROSS/BLUE SHIELD | Admitting: Physical Therapy

## 2014-11-02 DIAGNOSIS — G5701 Lesion of sciatic nerve, right lower limb: Secondary | ICD-10-CM

## 2014-11-02 NOTE — Therapy (Signed)
[facility] [place], [place], [address] Phone: [phone]   Fax:  [phone]  Physical Therapy Treatment  Patient Details  Name: Yvonne Hall MRN: [id number] Date of Birth: 1960/06/11 Referring Provider:  Hendricks Limes, MD  Encounter Date: 11/02/2014      PT End of Session - 11/02/14 0734    Visit Number 3   Number of Visits 12   Date for PT Re-Evaluation 12/03/14   PT Start Time 0733   PT Stop Time 0815   PT Time Calculation (min) 42 min   Activity Tolerance Patient tolerated treatment well   Behavior During Therapy [facility] for tasks assessed/performed      Past Medical History  Diagnosis Date  . Hyperlipidemia   . FH: colonic polyps   . Plantar fasciitis 2012    partial tear on MRI ; Dr Steffanie Rainwater & Dr Stefanie Libel  . PPD positive, treated 1994    6 months INH  . CTS (carpal tunnel syndrome)     overnight splints  . Squamous cell carcinoma 6 & 10 / 2013    cryotherapy & topical cream ; Dr Tonia Brooms  . Osteoporosis     Past Surgical History  Procedure Laterality Date  . Tubal ligation    . G3  p2    . Cesarean section      x2  . Wide resection      R shoulder pre malignant  lesion; Dr Tonia Brooms  . Radial keratotomy  1998  . Colonoscopy w/ polypectomy  2003 & 2013    negative 2008,  Dr Fuller Plan, due 2018  . Retina pexy  2015    for pin hole retinla bleed; Dr Gershon Crane    There were no vitals filed for this visit.  Visit Diagnosis:  Piriformis syndrome, right      Subjective Assessment - 11/02/14 0734    Subjective Pain is better and not as tight. Doesnt hurt as much in the RLE.   Limitations Sitting   How long can you sit comfortably? 20-30 minutes.   Patient Stated Goals Get out of pain.   Currently in Pain? Yes   Pain Score 3             [facility] PT Assessment - 11/02/14 0001    Assessment   Medical Diagnosis Pain in joint, pelvic region and thigh.                     [facility]  Adult PT Treatment/Exercise - 11/02/14 0001    Modalities   Modalities Ultrasound   Ultrasound   Ultrasound Location Combo to R piriformis in L sidelying   Ultrasound Parameters 1.5 w/cm2   Ultrasound Goals Pain   Manual Therapy   Manual Therapy Myofascial release   Myofascial Release STW/ TPR to R piriformis in L sidelying to decrease tightness and pain                     PT Long Term Goals - 10/30/14 0827    PT LONG TERM GOAL #1   Title Ind with HEP.   Time 6   Period Weeks   Status On-going   PT LONG TERM GOAL #2   Title Perform ADL's with pain not > 3/10.   Time 6   Period Weeks   Status On-going   PT LONG TERM GOAL #3   Title Sit 30 minutes with pain not > 3/10.   Time  6   Period Weeks   Status On-going               Plan - 11/02/14 5498    Clinical Impression Statement Patient tolerated treatment well today with modalities and STW. Moderate tightness were noted in the R piriformis during STW along with continued nodules of tightness in the muscles. Patient was able to tolerate moderate pressure during STW/TPR without complaints of soreness or pain. Normal modalities response noted following removal of the modlaties. Denied pain following treatment and Biofreeze was applied to the R glut region.   Pt will benefit from skilled therapeutic intervention in order to improve on the following deficits Pain;Decreased activity tolerance   Rehab Potential Excellent   PT Frequency 2x / week   PT Duration 6 weeks   PT Treatment/Interventions Moist Heat;Electrical Stimulation;Ultrasound;Therapeutic exercise;Passive range of motion;Manual techniques   PT Next Visit Plan Continue per MPT POC.   Consulted and Agree with Plan of Care Patient        Problem List Patient Active Problem List   Diagnosis Date Noted  . Osteoporosis 04/04/2013  . 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 Right carotid bruit 03/21/2013  . Plantar fasciitis 06/01/2011  . HIP PAIN,  RIGHT 03/21/2010  . 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 COLLES' FRACTURE 12/02/2009  . HYPOGLYCEMIA, UNSPECIFIED 03/22/2007  . HYPERLIPIDEMIA 03/22/2007  . Carpal tunnel syndrome 03/22/2007  . 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 History of colonic polyps 03/22/2007    Wynelle Fanny, PTA 11/02/2014, 8:31 AM  [facility] [address] [place], [place], [address] Phone: [phone]   Fax:  [phone]

## 2014-11-06 ENCOUNTER — Ambulatory Visit (INDEPENDENT_AMBULATORY_CARE_PROVIDER_SITE_OTHER): Payer: BLUE CROSS/BLUE SHIELD | Admitting: Sports Medicine

## 2014-11-06 ENCOUNTER — Encounter: Payer: Self-pay | Admitting: Physical Therapy

## 2014-11-06 ENCOUNTER — Ambulatory Visit: Payer: BLUE CROSS/BLUE SHIELD | Admitting: Physical Therapy

## 2014-11-06 ENCOUNTER — Encounter: Payer: Self-pay | Admitting: Sports Medicine

## 2014-11-06 VITALS — BP 105/45 | HR 65 | Ht 61.0 in | Wt 168.0 lb

## 2014-11-06 DIAGNOSIS — M25551 Pain in right hip: Secondary | ICD-10-CM

## 2014-11-06 DIAGNOSIS — R0989 Other specified symptoms and signs involving the circulatory and respiratory systems: Secondary | ICD-10-CM | POA: Diagnosis not present

## 2014-11-06 DIAGNOSIS — G5701 Lesion of sciatic nerve, right lower limb: Secondary | ICD-10-CM | POA: Diagnosis not present

## 2014-11-06 NOTE — Therapy (Signed)
[facility] [place], [place], [address] Phone: [phone]   Fax:  [phone]  Physical Therapy Treatment  Patient Details  Name: Yvonne Hall MRN: [id number] Date of Birth: 1961-04-27 Referring Provider:  Hendricks Limes, MD  Encounter Date: 11/06/2014      PT End of Session - 11/06/14 0824    Visit Number 4   Number of Visits 12   Date for PT Re-Evaluation 12/03/14   PT Start Time 0730   PT Stop Time 0813   PT Time Calculation (min) 43 min   Activity Tolerance Patient tolerated treatment well   Behavior During Therapy [facility] for tasks assessed/performed      Past Medical History  Diagnosis Date  . Hyperlipidemia   . FH: colonic polyps   . Plantar fasciitis 2012    partial tear on MRI ; Dr Steffanie Rainwater & Dr Stefanie Libel  . PPD positive, treated 1994    6 months INH  . CTS (carpal tunnel syndrome)     overnight splints  . Squamous cell carcinoma 6 & 10 / 2013    cryotherapy & topical cream ; Dr Tonia Brooms  . Osteoporosis     Past Surgical History  Procedure Laterality Date  . Tubal ligation    . G3  p2    . Cesarean section      x2  . Wide resection      R shoulder pre malignant  lesion; Dr Tonia Brooms  . Radial keratotomy  1998  . Colonoscopy w/ polypectomy  2003 & 2013    negative 2008,  Dr Fuller Plan, due 2018  . Retina pexy  2015    for pin hole retinla bleed; Dr Gershon Crane    There were no vitals filed for this visit.  Visit Diagnosis:  Piriformis syndrome, right      Subjective Assessment - 11/06/14 0823    Subjective Reports that R hip is getting better with every visit. Has been doing the exercises Dr. Oneida Alar gave her. States that driving and sitting are the worst.   Limitations Sitting   How long can you sit comfortably? 20-30 minutes.   Patient Stated Goals Get out of pain.   Currently in Pain? Yes   Pain Score 2    Pain Location Hip   Pain Orientation Right   Pain Frequency Intermittent   Aggravating Factors  Sitting, driving   Pain Relieving Factors Rest            [facility] PT Assessment - 11/06/14 0001    Assessment   Medical Diagnosis Pain in joint, pelvic region and thigh.                     [facility] Adult PT Treatment/Exercise - 11/06/14 0001    Exercises   Exercises Knee/Hip   Knee/Hip Exercises: Stretches   Piriformis Stretch Right;3 reps;30 seconds   Other Knee/Hip Stretches R SKTC stretch 3 x 30 sec   Knee/Hip Exercises: Standing   Hip Flexion AROM;Right;3 sets;10 reps;Knee bent  R hip abduction   Knee/Hip Exercises: Supine   Straight Leg Raises Strengthening;Right;3 sets;10 reps   Knee/Hip Exercises: Sidelying   Hip ABduction Strengthening;Right;3 sets;10 reps   Clams L sidelying RLE 3 x10 reps   Knee/Hip Exercises: Prone   Hip Extension Strengthening;Right;3 sets;10 reps   Other Prone Exercises POE roadkill with R knee abducted and flexed x3 min   Modalities   Modalities Ultrasound   Ultrasound   Ultrasound  Location R piriformis   Ultrasound Parameters 1.5 w/cm2, 100%, 1 mhz   Ultrasound Goals Pain                     PT Long Term Goals - 11/06/14 0827    PT LONG TERM GOAL #1   Title Ind with HEP.   Time 6   Period Weeks   Status Achieved   PT LONG TERM GOAL #2   Title Perform ADL's with pain not > 3/10.   Time 6   Period Weeks   Status On-going   PT LONG TERM GOAL #3   Title Sit 30 minutes with pain not > 3/10.   Time 6   Period Weeks   Status On-going               Plan - 11/06/14 3662    Clinical Impression Statement Patient tolerated treatment well today with only reporting a small pull in R hip during clam exercises. Very minimal tightness noted during R hip palpation while applying Biofreeze in clinic today. Did well with exercises and demonstrated good technique. Normal modalites respones noted following removal of the modalties. Experiences "noticeable" feeling in R hip following treatment and  during everyday ADLs and walking.   Pt will benefit from skilled therapeutic intervention in order to improve on the following deficits Pain;Decreased activity tolerance   Rehab Potential Excellent   PT Frequency 2x / week   PT Duration 6 weeks   PT Treatment/Interventions Moist Heat;Electrical Stimulation;Ultrasound;Therapeutic exercise;Passive range of motion;Manual techniques   PT Next Visit Plan Continue per MPT POC.   Consulted and Agree with Plan of Care Patient        Problem List Patient Active Problem List   Diagnosis Date Noted  . Osteoporosis 04/04/2013  . 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 Right carotid bruit 03/21/2013  . Plantar fasciitis 06/01/2011  . HIP PAIN, RIGHT 03/21/2010  . 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 COLLES' FRACTURE 12/02/2009  . HYPOGLYCEMIA, UNSPECIFIED 03/22/2007  . HYPERLIPIDEMIA 03/22/2007  . Carpal tunnel syndrome 03/22/2007  . 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 History of colonic polyps 03/22/2007    Wynelle Fanny, PTA 11/06/2014, 8:32 AM  [facility] [address] [place], [place], [address] Phone: [phone]   Fax:  [phone]

## 2014-11-06 NOTE — Assessment & Plan Note (Addendum)
As Dr. Linna Darner previously described, positional swish described as a bruit, she is a Marine scientist.  Possibly vertebral artery compression and transmission of the resulting bruit based on the head position she adopts when she hears this sound Provided reassurance  I checked carotid in several positions and could not ID bruit today

## 2014-11-06 NOTE — Progress Notes (Signed)
Patient ID: Yvonne Hall, female   DOB: 08/05/1960, 54 y.o.   MRN: [id number]   HPI  Patient presents today for follow up posterior hip apin  Patient had fluid seen on [place] deep to the piriformis, gluteus medius, and IT band around the greater troch.   She has had some improvement with nitro patch and physical therapy.   She describes R posterior hip/buttock pain worse with sitting and improved with walking. She denies side effects from Nitro. She feels PT is helping.    PMH: Smoking status noted - never ROS: Per HPI  Objective: BP 105/45 mmHg  Pulse 65  Ht 5\' 1"  (1.549 m)  Wt 168 lb (76.204 kg)  BMI 31.76 kg/m2  LMP 08/03/2011 Gen: NAD, alert, cooperative with exam HEENT: NCAT, normal dix hallpike Neck: no bruit appreciated Ext: No edema, warm Neuro: Alert and oriented, No gross deficits, cn 2-12 intact MSK:  No pain with hip rotation or log roll Strength full BL LE No Trendelenburg  Assessment and plan:  HIP PAIN, RIGHT Previously with fluid deep to the piriformis and IT band on [place] Some improvement with HEP, PT, and nitro patch Continue PT and nitro patch, likely repeat US in 2 months when she returns  Right carotid bruit As Dr. Linna Darner previously described, positional swish described as a bruit, she is a Marine scientist.  Possibly vertebral artery compression and transmission of the resulting bruit  Provided reassurance

## 2014-11-06 NOTE — Assessment & Plan Note (Signed)
Previously with fluid deep to the piriformis and IT band on [place] Some improvement with HEP, PT, and nitro patch Continue PT and nitro patch, likely repeat US in 2 months when she returns

## 2014-11-08 ENCOUNTER — Ambulatory Visit: Payer: BLUE CROSS/BLUE SHIELD | Admitting: Physical Therapy

## 2014-11-08 DIAGNOSIS — G5701 Lesion of sciatic nerve, right lower limb: Secondary | ICD-10-CM | POA: Diagnosis not present

## 2014-11-08 NOTE — Therapy (Signed)
[facility] [place], [place], [address] Phone: [phone]   Fax:  [phone]  Physical Therapy Treatment  Patient Details  Name: Yvonne Hall MRN: [id number] Date of Birth: 06/05/1960 Referring Provider:  Hendricks Limes, MD  Encounter Date: 11/08/2014      PT End of Session - 11/08/14 1613    Visit Number 5   Number of Visits 12   Date for PT Re-Evaluation 12/03/14   PT Start Time 1612   PT Stop Time 1651   PT Time Calculation (min) 39 min   Activity Tolerance Patient tolerated treatment well   Behavior During Therapy [facility] for tasks assessed/performed      Past Medical History  Diagnosis Date  . Hyperlipidemia   . FH: colonic polyps   . Plantar fasciitis 2012    partial tear on MRI ; Dr Steffanie Rainwater & Dr Stefanie Libel  . PPD positive, treated 1994    6 months INH  . CTS (carpal tunnel syndrome)     overnight splints  . Squamous cell carcinoma 6 & 10 / 2013    cryotherapy & topical cream ; Dr Tonia Brooms  . Osteoporosis     Past Surgical History  Procedure Laterality Date  . Tubal ligation    . G3  p2    . Cesarean section      x2  . Wide resection      R shoulder pre malignant  lesion; Dr Tonia Brooms  . Radial keratotomy  1998  . Colonoscopy w/ polypectomy  2003 & 2013    negative 2008,  Dr Fuller Plan, due 2018  . Retina pexy  2015    for pin hole retinla bleed; Dr Gershon Crane    There were no vitals filed for this visit.  Visit Diagnosis:  Piriformis syndrome, right      Subjective Assessment - 11/08/14 1631    Subjective Has a busy schedule with the month of July, so MD instructed to hold for 2-3 weeks and attain an HEP. States she did a good amount of sitting today.   Limitations Sitting   How long can you sit comfortably? 20-30 minutes.   Patient Stated Goals Get out of pain.   Currently in Pain? Yes   Pain Score 2    Pain Location Hip   Pain Orientation Right   Pain Descriptors / Indicators  Aching;Sore            [facility] PT Assessment - 11/08/14 0001    Assessment   Medical Diagnosis Pain in joint, pelvic region and thigh.                     [facility] PT Treatment/Exercise - 11/08/14 0001    Knee/Hip Exercises: Stretches   Piriformis Stretch Right;3 reps;30 seconds   Other Knee/Hip Stretches R SKTC stretch 3 x 30 sec   Knee/Hip Exercises: Standing   Hip Flexion AROM;Right;3 sets;10 reps;Knee bent  R hip abduction   Hip Abduction Stengthening;Right;3 sets;10 reps;Knee straight;Other (comment)  Yellow theraband   Knee/Hip Exercises: Supine   Bridges Strengthening;2 sets;10 reps   Straight Leg Raises Strengthening;Right;3 sets;10 reps   Knee/Hip Exercises: Sidelying   Clams L sidelying RLE 3 x10 reps   Knee/Hip Exercises: Prone   Hip Extension Strengthening;Right;3 sets;10 reps   Other Prone Exercises POE roadkill with R knee abducted and flexed x3 min   Modalities   Modalities Ultrasound   Ultrasound   Ultrasound Location R piriformis  Ultrasound Parameters 1.5 w/cm2, 100%, 27mhz    Ultrasound Goals Pain                     PT Long Term Goals - 11/08/14 1632    PT LONG TERM GOAL #1   Title Ind with HEP.   Time 6   Period Weeks   Status Achieved   PT LONG TERM GOAL #2   Title Perform ADL's with pain not > 3/10.   Time 6   Period Weeks   Status Achieved   PT LONG TERM GOAL #3   Title Sit 30 minutes with pain not > 3/10.   Time 6   Period Weeks   Status On-going               Plan - 11/08/14 1702    Clinical Impression Statement Patient tolerated treatment well without complaint of increased pain during the treatment only fatigue from daily routine. Completed exercises with minimal verbal cueing needed to verbalize the exercise to be completed with minimal demonstration needed. Accepted new HEP exercise and yellow band without questions. Achieved ADLs with pain less than 3/10 pain goal today and HEP goal. Sitting  continues to be the biggest cause of R hip pain. Normal modalities response noted following removal of the modalties. Experienced fatigue from the exercises and from daily routine but no pain.   Pt will benefit from skilled therapeutic intervention in order to improve on the following deficits Pain;Decreased activity tolerance   Rehab Potential Excellent   PT Frequency 2x / week   PT Duration 6 weeks   PT Treatment/Interventions Moist Heat;Electrical Stimulation;Ultrasound;Therapeutic exercise;Passive range of motion;Manual techniques   PT Next Visit Plan Patient will be on hold for 2-3 weeks.   Consulted and Agree with Plan of Care Patient        Problem List Patient Active Problem List   Diagnosis Date Noted  . Osteoporosis 04/04/2013  . 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 Right carotid bruit 03/21/2013  . Plantar fasciitis 06/01/2011  . HIP PAIN, RIGHT 03/21/2010  . 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 COLLES' FRACTURE 12/02/2009  . HYPOGLYCEMIA, UNSPECIFIED 03/22/2007  . HYPERLIPIDEMIA 03/22/2007  . Carpal tunnel syndrome 03/22/2007  . 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 History of colonic polyps 03/22/2007    KWynelle Fanny PTA 11/08/2014, 5:08 PM  [facility] [address] [address]Phone: [phone]  Fax:  [phone] PHYSICAL THERAPY DISCHARGE SUMMARY  Visits from Start of Care: 5.  Current functional level related to goals / functional outcomes: Please see above.   Remaining deficits: Continued pain with sitting.   Education / Equipment: HEP. Plan: Patient agrees to discharge.  Patient goals were partially met. Patient is being discharged due to being pleased with the current functional level.  ?????        CMaliApplegate MPT

## 2014-11-08 NOTE — Patient Instructions (Signed)
Strengthening: Hip Abduction - Resisted   With tubing around right leg, other side toward anchor, extend leg out from side. Repeat __10__ times per set. Do __2-3__ sets per session. Do _2-3___ sessions per day.  http://orth.exer.us/634   Copyright  VHI. All rights reserved.  Strengthening: Hip Extension (Prone)   Tighten muscles on front of left thigh, then lift leg _4___ inches from surface, keeping knee locked. Repeat _10___ times per set. Do _2-3___ sets per session. Do _2-3___ sessions per day.  http://orth.exer.us/620   Copyright  VHI. All rights reserved.  Knee-to-Chest Stretch: Unilateral   With hand behind right knee, pull knee in to chest until a comfortable stretch is felt in lower back and buttocks. Keep back relaxed. Hold _30___ seconds. Repeat __3__ times per set. Do _1___ sets per session. Do __2-3__ sessions per day.  http://orth.exer.us/126   Copyright  VHI. All rights reserved.

## 2014-11-09 ENCOUNTER — Encounter: Payer: Self-pay | Admitting: Gastroenterology

## 2014-11-19 ENCOUNTER — Other Ambulatory Visit: Payer: Self-pay

## 2014-11-19 DIAGNOSIS — Z1231 Encounter for screening mammogram for malignant neoplasm of breast: Secondary | ICD-10-CM

## 2015-01-21 ENCOUNTER — Ambulatory Visit
Admission: RE | Admit: 2015-01-21 | Discharge: 2015-01-21 | Disposition: A | Discharge status: Home / Self Care | Payer: BLUE CROSS/BLUE SHIELD | Source: Ambulatory Visit

## 2015-01-21 DIAGNOSIS — Z1231 Encounter for screening mammogram for malignant neoplasm of breast: Secondary | ICD-10-CM

## 2015-03-03 ENCOUNTER — Other Ambulatory Visit: Payer: Self-pay | Admitting: Internal Medicine

## 2015-03-04 ENCOUNTER — Other Ambulatory Visit: Payer: Self-pay | Admitting: Emergency Medicine

## 2015-03-04 DIAGNOSIS — M810 Age-related osteoporosis without current pathological fracture: Secondary | ICD-10-CM

## 2015-03-04 MED ORDER — ALENDRONATE SODIUM 70 MG PO TABS: 70.0000 mg | ORAL_TABLET | ORAL | Status: DC

## 2015-03-06 LAB — HEPATIC FUNCTION PANEL
ALT: 22 U/L (ref 7–35)
AST: 23 U/L (ref 13–35)
Alkaline Phosphatase: 36 U/L (ref 25–125)
Bilirubin, Total: 0.5 mg/dL

## 2015-03-06 LAB — BASIC METABOLIC PANEL
BUN: 12 mg/dL (ref 4–21)
Creatinine: 0.7 mg/dL (ref 0.5–1.1)
Potassium: 4.2 mmol/L (ref 3.4–5.3)
SODIUM: 143 mmol/L (ref 137–147)

## 2015-03-06 LAB — CBC AND DIFFERENTIAL
HCT: 45 % (ref 36–46)
Hemoglobin: 14.5 g/dL (ref 12.0–16.0)
Platelets: 160 10*3/uL (ref 150–399)

## 2015-03-06 LAB — LIPID PANEL
CHOLESTEROL: 208 mg/dL — AB (ref 0–200)
HDL: 67 mg/dL (ref 35–70)
LDL Cholesterol: 115 mg/dL
TRIGLYCERIDES: 83 mg/dL (ref 40–160)

## 2015-03-14 ENCOUNTER — Encounter: Payer: Self-pay | Admitting: Internal Medicine

## 2015-03-22 ENCOUNTER — Ambulatory Visit (INDEPENDENT_AMBULATORY_CARE_PROVIDER_SITE_OTHER): Payer: BLUE CROSS/BLUE SHIELD | Admitting: Internal Medicine

## 2015-03-22 ENCOUNTER — Encounter: Payer: Self-pay | Admitting: Internal Medicine

## 2015-03-22 VITALS — BP 120/78 | HR 75 | Temp 98.1°F | Ht 61.0 in | Wt 164.0 lb

## 2015-03-22 DIAGNOSIS — M810 Age-related osteoporosis without current pathological fracture: Secondary | ICD-10-CM

## 2015-03-22 DIAGNOSIS — Z0000 Encounter for general adult medical examination without abnormal findings: Secondary | ICD-10-CM | POA: Diagnosis not present

## 2015-03-22 DIAGNOSIS — L570 Actinic keratosis: Secondary | ICD-10-CM | POA: Insufficient documentation

## 2015-03-22 DIAGNOSIS — E782 Mixed hyperlipidemia: Secondary | ICD-10-CM

## 2015-03-22 MED ORDER — ALENDRONATE SODIUM 70 MG PO TABS: 70.0000 mg | ORAL_TABLET | ORAL | Status: DC

## 2015-03-22 MED ORDER — PRAVASTATIN SODIUM 20 MG PO TABS: 20.0000 mg | ORAL_TABLET | Freq: Every day | ORAL | Status: DC

## 2015-03-22 NOTE — Progress Notes (Signed)
Pre visit review using our clinic review tool, if applicable. No additional management support is needed unless otherwise documented below in the visit note. 

## 2015-03-22 NOTE — Progress Notes (Signed)
   Subjective:    Patient ID: Yvonne Hall, female    DOB: [birth year], 54 y.o.   MRN: [id number]  HPI She is here for a physical;acute issues include piriformis syndrome for which she sees Dr. Oneida Alar.  She has red meat one time or less per week. She also has fried foods 1 time or less. She is avoiding salt. She exercises 3-5 times per week for 45 minutes without associated cardio pulmonary symptoms  Review of systems is positive for the symptoms related to piriformis syndrome with intermittent right hip pain.  She also has carpal tunnel and will wear braces on her wrists as needed. She does have intermittent tingling.  Review of systems otherwise unremarkable.  There's been no change in her family history.  She had extensive labs performed at her place of employment. All were therapeutic or at goal.  Review of Systems  Chest pain, palpitations, tachycardia, exertional dyspnea, paroxysmal nocturnal dyspnea, claudication or edema are absent. No unexplained weight loss, abdominal pain, significant dyspepsia, dysphagia, melena, rectal bleeding, or persistently small caliber stools. Dysuria, pyuria, hematuria, frequency, nocturia or polyuria are denied. Change in hair, skin, nails denied. No bowel changes of constipation or diarrhea. No intolerance to heat or cold.     Objective:   Physical Exam  Pertinent or positive findings include:some slight knee crepitus present  General appearance :adequately nourished; in no distress.  Eyes: No conjunctival inflammation or scleral icterus is present.  Oral exam:  Lips and gums are healthy appearing.There is no oropharyngeal erythema or exudate noted. Dental hygiene is good.  Heart:  Normal rate and regular rhythm. S1 and S2 normal without gallop, murmur, click, rub or other extra sounds    Lungs:Chest clear to auscultation; no wheezes, rhonchi,rales ,or rubs present.No increased work of breathing.   Abdomen: bowel sounds normal, soft and  non-tender without masses, organomegaly or hernias noted.  No guarding or rebound.   Vascular : all pulses equal ; no bruits present.  Skin:Warm & dry.  Intact without suspicious lesions or rashes ; no tenting or jaundice   Lymphatic: No lymphadenopathy is noted about the head, neck, axilla.   Neuro: Strength, tone & DTRs normal.     Assessment & Plan:  #1 comprehensive physical exam; no acute findings  Plan: see Orders  & Recommendations

## 2015-03-22 NOTE — Patient Instructions (Signed)
  Your next office appointment will be determined based upon review of your pending BMD Those written interpretation of the lab results and instructions will be transmitted to you by My Chart Critical results will be called.   Followup as needed for any active or acute issue. Please report any significant change in your symptoms.

## 2015-04-28 ENCOUNTER — Other Ambulatory Visit: Payer: Self-pay | Admitting: Internal Medicine

## 2015-12-13 ENCOUNTER — Other Ambulatory Visit: Payer: Self-pay | Admitting: Obstetrics & Gynecology

## 2015-12-13 DIAGNOSIS — Z1231 Encounter for screening mammogram for malignant neoplasm of breast: Secondary | ICD-10-CM

## 2016-02-03 ENCOUNTER — Ambulatory Visit: Payer: BLUE CROSS/BLUE SHIELD

## 2016-02-03 ENCOUNTER — Other Ambulatory Visit: Payer: Self-pay | Admitting: Obstetrics & Gynecology

## 2016-02-03 ENCOUNTER — Ambulatory Visit
Admission: RE | Admit: 2016-02-03 | Discharge: 2016-02-03 | Disposition: A | Discharge status: Home / Self Care | Payer: BLUE CROSS/BLUE SHIELD | Source: Ambulatory Visit | Attending: Obstetrics & Gynecology | Admitting: Obstetrics & Gynecology

## 2016-02-03 DIAGNOSIS — Z1231 Encounter for screening mammogram for malignant neoplasm of breast: Secondary | ICD-10-CM

## 2016-02-05 LAB — CYTOLOGY - PAP

## 2016-03-19 ENCOUNTER — Encounter: Payer: Self-pay | Admitting: Internal Medicine

## 2016-03-19 NOTE — Patient Instructions (Addendum)
Your blood work was reviewed.  All other Health Maintenance issues reviewed.   All recommended immunizations and age-appropriate screenings are up-to-date or discussed.  No immunizations administered today.   Medications reviewed and updated.  No changes recommended at this time.  Your prescription(s) have been submitted to your pharmacy. Please take as directed and contact our office if you believe you are having problem(s) with the medication(s).   Please followup in one year for a physical   Health Maintenance, Female Adopting a healthy lifestyle and getting preventive care can go a long way to promote health and wellness. Talk with your health care provider about what schedule of regular examinations is right for you. This is a good chance for you to check in with your provider about disease prevention and staying healthy. In between checkups, there are plenty of things you can do on your own. Experts have done a lot of research about which lifestyle changes and preventive measures are most likely to keep you healthy. Ask your health care provider for more information. WEIGHT AND DIET  Eat a healthy diet  Be sure to include plenty of vegetables, fruits, low-fat dairy products, and lean protein.  Do not eat a lot of foods high in solid fats, added sugars, or salt.  Get regular exercise. This is one of the most important things you can do for your health.  Most adults should exercise for at least 150 minutes each week. The exercise should increase your heart rate and make you sweat (moderate-intensity exercise).  Most adults should also do strengthening exercises at least twice a week. This is in addition to the moderate-intensity exercise.  Maintain a healthy weight  Body mass index (BMI) is a measurement that can be used to identify possible weight problems. It estimates body fat based on height and weight. Your health care provider can help determine your BMI and help you  achieve or maintain a healthy weight.  For females 93 years of age and older:   A BMI below 18.5 is considered underweight.  A BMI of 18.5 to 24.9 is normal.  A BMI of 25 to 29.9 is considered overweight.  A BMI of 30 and above is considered obese.  Watch levels of cholesterol and blood lipids  You should start having your blood tested for lipids and cholesterol at 56 years of age, then have this test every 5 years.  You may need to have your cholesterol levels checked more often if:  Your lipid or cholesterol levels are high.  You are older than 56 years of age.  You are at high risk for heart disease.  CANCER SCREENING   Lung Cancer  Lung cancer screening is recommended for adults 51-41 years old who are at high risk for lung cancer because of a history of smoking.  A yearly low-dose CT scan of the lungs is recommended for people who:  Currently smoke.  Have quit within the past 15 years.  Have at least a 30-pack-year history of smoking. A pack year is smoking an average of one pack of cigarettes a day for 1 year.  Yearly screening should continue until it has been 15 years since you quit.  Yearly screening should stop if you develop a health problem that would prevent you from having lung cancer treatment.  Breast Cancer  Practice breast self-awareness. This means understanding how your breasts normally appear and feel.  It also means doing regular breast self-exams. Let your health care provider know about  any changes, no matter how small.  If you are in your 20s or 30s, you should have a clinical breast exam (CBE) by a health care provider every 1-3 years as part of a regular health exam.  If you are 66 or older, have a CBE every year. Also consider having a breast X-ray (mammogram) every year.  If you have a family history of breast cancer, talk to your health care provider about genetic screening.  If you are at high risk for breast cancer, talk to your  health care provider about having an MRI and a mammogram every year.  Breast cancer gene (BRCA) assessment is recommended for women who have family members with BRCA-related cancers. BRCA-related cancers include:  Breast.  Ovarian.  Tubal.  Peritoneal cancers.  Results of the assessment will determine the need for genetic counseling and BRCA1 and BRCA2 testing. Cervical Cancer Your health care provider may recommend that you be screened regularly for cancer of the pelvic organs (ovaries, uterus, and vagina). This screening involves a pelvic examination, including checking for microscopic changes to the surface of your cervix (Pap test). You may be encouraged to have this screening done every 3 years, beginning at age 52.  For women ages 33-65, health care providers may recommend pelvic exams and Pap testing every 3 years, or they may recommend the Pap and pelvic exam, combined with testing for human papilloma virus (HPV), every 5 years. Some types of HPV increase your risk of cervical cancer. Testing for HPV may also be done on women of any age with unclear Pap test results.  Other health care providers may not recommend any screening for nonpregnant women who are considered low risk for pelvic cancer and who do not have symptoms. Ask your health care provider if a screening pelvic exam is right for you.  If you have had past treatment for cervical cancer or a condition that could lead to cancer, you need Pap tests and screening for cancer for at least 20 years after your treatment. If Pap tests have been discontinued, your risk factors (such as having a new sexual partner) need to be reassessed to determine if screening should resume. Some women have medical problems that increase the chance of getting cervical cancer. In these cases, your health care provider may recommend more frequent screening and Pap tests. Colorectal Cancer  This type of cancer can be detected and often  prevented.  Routine colorectal cancer screening usually begins at 56 years of age and continues through 56 years of age.  Your health care provider may recommend screening at an earlier age if you have risk factors for colon cancer.  Your health care provider may also recommend using home test kits to check for hidden blood in the stool.  A small camera at the end of a tube can be used to examine your colon directly (sigmoidoscopy or colonoscopy). This is done to check for the earliest forms of colorectal cancer.  Routine screening usually begins at age 30.  Direct examination of the colon should be repeated every 5-10 years through 56 years of age. However, you may need to be screened more often if early forms of precancerous polyps or small growths are found. Skin Cancer  Check your skin from head to toe regularly.  Tell your health care provider about any new moles or changes in moles, especially if there is a change in a mole's shape or color.  Also tell your health care provider if you have  a mole that is larger than the size of a pencil eraser.  Always use sunscreen. Apply sunscreen liberally and repeatedly throughout the day.  Protect yourself by wearing long sleeves, pants, a wide-brimmed hat, and sunglasses whenever you are outside. HEART DISEASE, DIABETES, AND HIGH BLOOD PRESSURE   High blood pressure causes heart disease and increases the risk of stroke. High blood pressure is more likely to develop in:  People who have blood pressure in the high end of the normal range (130-139/85-89 mm Hg).  People who are overweight or obese.  People who are African American.  If you are 46-34 years of age, have your blood pressure checked every 3-5 years. If you are 28 years of age or older, have your blood pressure checked every year. You should have your blood pressure measured twice--once when you are at a hospital or clinic, and once when you are not at a hospital or clinic.  Record the average of the two measurements. To check your blood pressure when you are not at a hospital or clinic, you can use:  An automated blood pressure machine at a pharmacy.  A home blood pressure monitor.  If you are between 67 years and 28 years old, ask your health care provider if you should take aspirin to prevent strokes.  Have regular diabetes screenings. This involves taking a blood sample to check your fasting blood sugar level.  If you are at a normal weight and have a low risk for diabetes, have this test once every three years after 56 years of age.  If you are overweight and have a high risk for diabetes, consider being tested at a younger age or more often. PREVENTING INFECTION  Hepatitis B  If you have a higher risk for hepatitis B, you should be screened for this virus. You are considered at high risk for hepatitis B if:  You were born in a country where hepatitis B is common. Ask your health care provider which countries are considered high risk.  Your parents were born in a high-risk country, and you have not been immunized against hepatitis B (hepatitis B vaccine).  You have HIV or AIDS.  You use needles to inject street drugs.  You live with someone who has hepatitis B.  You have had sex with someone who has hepatitis B.  You get hemodialysis treatment.  You take certain medicines for conditions, including cancer, organ transplantation, and autoimmune conditions. Hepatitis C  Blood testing is recommended for:  Everyone born from [birth year] through 1966.  Anyone with known risk factors for hepatitis C. Sexually transmitted infections (STIs)  You should be screened for sexually transmitted infections (STIs) including gonorrhea and chlamydia if:  You are sexually active and are younger than 56 years of age.  You are older than 56 years of age and your health care provider tells you that you are at risk for this type of infection.  Your sexual activity  has changed since you were last screened and you are at an increased risk for chlamydia or gonorrhea. Ask your health care provider if you are at risk.  If you do not have HIV, but are at risk, it may be recommended that you take a prescription medicine daily to prevent HIV infection. This is called pre-exposure prophylaxis (PrEP). You are considered at risk if:  You are sexually active and do not regularly use condoms or know the HIV status of your partner(s).  You take drugs by injection.  You  are sexually active with a partner who has HIV. Talk with your health care provider about whether you are at high risk of being infected with HIV. If you choose to begin PrEP, you should first be tested for HIV. You should then be tested every 3 months for as long as you are taking PrEP.  PREGNANCY   If you are premenopausal and you may become pregnant, ask your health care provider about preconception counseling.  If you may become pregnant, take 400 to 800 micrograms (mcg) of folic acid every day.  If you want to prevent pregnancy, talk to your health care provider about birth control (contraception). OSTEOPOROSIS AND MENOPAUSE   Osteoporosis is a disease in which the bones lose minerals and strength with aging. This can result in serious bone fractures. Your risk for osteoporosis can be identified using a bone density scan.  If you are 28 years of age or older, or if you are at risk for osteoporosis and fractures, ask your health care provider if you should be screened.  Ask your health care provider whether you should take a calcium or vitamin D supplement to lower your risk for osteoporosis.  Menopause may have certain physical symptoms and risks.  Hormone replacement therapy may reduce some of these symptoms and risks. Talk to your health care provider about whether hormone replacement therapy is right for you.  HOME CARE INSTRUCTIONS   Schedule regular health, dental, and eye  exams.  Stay current with your immunizations.   Do not use any tobacco products including cigarettes, chewing tobacco, or electronic cigarettes.  If you are pregnant, do not drink alcohol.  If you are breastfeeding, limit how much and how often you drink alcohol.  Limit alcohol intake to no more than 1 drink per day for nonpregnant women. One drink equals 12 ounces of beer, 5 ounces of wine, or 1 ounces of hard liquor.  Do not use street drugs.  Do not share needles.  Ask your health care provider for help if you need support or information about quitting drugs.  Tell your health care provider if you often feel depressed.  Tell your health care provider if you have ever been abused or do not feel safe at home.   This information is not intended to replace advice given to you by your health care provider. Make sure you discuss any questions you have with your health care provider.   Document Released: 11/10/2010 Document Revised: 05/18/2014 Document Reviewed: 03/29/2013 Elsevier Interactive Patient Education [employer].

## 2016-03-19 NOTE — Progress Notes (Signed)
Subjective:    Patient ID: Yvonne Hall, female    DOB: May 24, 1960, 55 y.o.   MRN: [id number]  HPI She is here to establish with a new pcp.  She is here for a physical exam.   December she will be having a vitrectomy.    She walks daily at lunch.  She already had her blood work done and has a copy here.   She denies changes in her health.   Medications and allergies reviewed with patient and updated if appropriate.  Patient Active Problem List   Diagnosis Date Noted  . Actinic keratosis 03/22/2015  . Osteoporosis 04/04/2013  . 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 Right carotid bruit 03/21/2013  . Plantar fasciitis 06/01/2011  . HIP PAIN, RIGHT 03/21/2010  . 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 COLLES' FRACTURE 12/02/2009  . HYPERLIPIDEMIA 03/22/2007  . Carpal tunnel syndrome 03/22/2007  . 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 History of colonic polyps 03/22/2007    Current Outpatient Prescriptions on File Prior to Visit  Medication Sig Dispense Refill  . aspirin 81 MG tablet Take 81 mg by mouth daily.     Marland Kitchen b complex vitamins tablet Take 1 tablet by mouth daily.    . calcium carbonate (OS-CAL) 600 MG TABS Take 600 mg by mouth daily. With Vit D 800 units    . Lecithin 400 MG CAPS Take 800 mg by mouth.     . Multiple Vitamin (MULTIVITAMIN) capsule Take 1 capsule by mouth daily.      . Omega-3 Fatty Acids (FISH OIL) 1000 MG CAPS Take by mouth.       No current facility-administered medications on file prior to visit.     Past Medical History:  Diagnosis Date  . CTS (carpal tunnel syndrome)    overnight splints  . FH: colonic polyps   . Hyperlipidemia   . Osteoporosis   . Plantar fasciitis 2012   partial tear on MRI ; Dr Steffanie Rainwater & Dr Stefanie Libel  . 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 Squamous cell carcinoma 6 & 10 / 2013   cryotherapy & topical cream ; Dr Tonia Brooms     Past Surgical History:  Procedure Laterality Date  . CATARACT EXTRACTION    . CESAREAN SECTION     x2  . COLONOSCOPY W/ POLYPECTOMY  2003 & 2013   negative 2008,  Dr Fuller Plan, due 2018  . G3  P2    . RADIAL KERATOTOMY  1998  . retina pexy  2015   for pin hole retinla bleed; Dr Gershon Crane  . TUBAL LIGATION    . wide resection     R shoulder pre malignant  lesion; Dr Tonia Brooms    Social History   Social History  . Marital status: Married    Spouse name: N/A  . Number of children: N/A  . Years of education: N/A   Occupational History  . Nurse [employer]   Social History Main Topics  . Smoking status: Never Smoker  . Smokeless tobacco: Never Used     Comment: but second hand smoke exposure  . Alcohol use No  . Drug use: No  . Sexual activity: Not Asked   Other Topics Concern  . None   Social History Narrative   Gets reg exercise          Family History  Problem Relation Age of  Onset  . Diabetes Mother   . Hypertension Mother   . Fibroids Mother     uterine  . Melanoma Father   . Colon polyps Father   . Hypertension Sister   . Skin cancer Sister     melanoma, basal cell   . Arthritis Maternal Aunt     Rheumatoid  . Hypertension Maternal Aunt   . Hyperlipidemia Paternal Aunt   . Osteopenia Paternal Aunt   . Cancer Paternal Aunt     melanoma,ovarian  . Heart attack Maternal Grandmother 76  . Hypertension Maternal Grandmother   . Hyperlipidemia Maternal Grandmother   . Diabetes Maternal Grandmother   . Stroke Maternal Grandmother 76  . Thyroid disease Maternal Grandmother   . Heart attack Maternal Grandfather     MI in 46s  . Colon cancer Paternal Grandfather     Review of Systems  Constitutional: Negative for appetite change, chills, fatigue, fever and unexpected weight change.  HENT: Negative for hearing loss.   Eyes: Positive for visual disturbance.  Respiratory: Negative for cough, shortness of breath and wheezing.    Cardiovascular: Negative for chest pain, palpitations and leg swelling.  Gastrointestinal: Positive for constipation (chronic, controlled). Negative for abdominal pain, blood in stool, diarrhea and nausea.       Occ GERD  Genitourinary: Negative for dysuria and hematuria.  Skin: Negative for color change and rash.  Neurological: Positive for headaches (sinus related). Negative for dizziness and light-headedness.  Psychiatric/Behavioral: Negative for dysphoric mood. The patient is not nervous/anxious.        Objective:   Vitals:   03/20/16 0801  BP: 126/62  Resp: 16  Temp: 98.1 F (36.7 C)   Filed Weights   03/20/16 0801  Weight: 159 lb (72.1 kg)   Body mass index is 30.04 kg/m.   Physical Exam Constitutional: She appears well-developed and well-nourished. No distress.  HENT:  Head: Normocephalic and atraumatic.  Right Ear: External ear normal. Normal ear canal and TM Left Ear: External ear normal.  Normal ear canal and TM Mouth/Throat: Oropharynx is clear and moist.  Eyes: Conjunctivae and EOM are normal.  Neck: Neck supple. No tracheal deviation present. No thyromegaly present.  No carotid bruit  Cardiovascular: Normal rate, regular rhythm and normal heart sounds.   No murmur heard.  No edema. Pulmonary/Chest: Effort normal and breath sounds normal. No respiratory distress. She has no wheezes. She has no rales.  Breast: deferred to Gyn Abdominal: Soft. She exhibits no distension. There is no tenderness.  Lymphadenopathy: She has no cervical adenopathy.  Skin: Skin is warm and dry. She is not diaphoretic.  Psychiatric: She has a normal mood and affect. Her behavior is normal.         Assessment & Plan:   Physical exam: Screening blood work  reviewed Immunizations  Flu vaccine today, others up to date  Colonoscopy  Up to date  Mammogram  Up to date  Gyn  Up to date  Dexa - due - discussed - will defer until next year at her request Eye exams  Up to date   Exercise - walking daily Weight - overweight - working on weight loss Skin  -- sees derm annually Substance abuse  - none  See Problem List for Assessment and Plan of chronic medical problems.  F/u annually

## 2016-03-19 NOTE — Assessment & Plan Note (Addendum)
Taking pravastatin Lipids well controlled continue above

## 2016-03-19 NOTE — Assessment & Plan Note (Addendum)
Taking calcium and vitamin d dexa deferred until next year per patient Exercising regularly - walks

## 2016-03-20 ENCOUNTER — Ambulatory Visit (INDEPENDENT_AMBULATORY_CARE_PROVIDER_SITE_OTHER): Payer: BLUE CROSS/BLUE SHIELD | Admitting: Internal Medicine

## 2016-03-20 ENCOUNTER — Encounter: Payer: Self-pay | Admitting: Internal Medicine

## 2016-03-20 VITALS — BP 126/62 | Temp 98.1°F | Resp 16 | Ht 61.0 in | Wt 159.0 lb

## 2016-03-20 DIAGNOSIS — E782 Mixed hyperlipidemia: Secondary | ICD-10-CM

## 2016-03-20 DIAGNOSIS — Z0000 Encounter for general adult medical examination without abnormal findings: Secondary | ICD-10-CM

## 2016-03-20 DIAGNOSIS — M810 Age-related osteoporosis without current pathological fracture: Secondary | ICD-10-CM

## 2016-03-20 MED ORDER — ALENDRONATE SODIUM 70 MG PO TABS: 70.0000 mg | ORAL_TABLET | ORAL | 3 refills | Status: DC

## 2016-03-20 MED ORDER — PRAVASTATIN SODIUM 20 MG PO TABS: 20.0000 mg | ORAL_TABLET | Freq: Every day | ORAL | 3 refills | Status: DC

## 2016-03-20 NOTE — Progress Notes (Signed)
Pre visit review using our clinic review tool, if applicable. No additional management support is needed unless otherwise documented below in the visit note. 

## 2016-05-09 ENCOUNTER — Telehealth: Payer: Self-pay

## 2016-05-09 NOTE — Telephone Encounter (Signed)
Pt lmom rq rx for miralax. Pt stated that her insurance would cover the generic version.

## 2016-05-12 MED ORDER — POLYETHYLENE GLYCOL 3350 17 GM/SCOOP PO POWD: 17.0000 g | Freq: Two times a day (BID) | ORAL | 1 refills | Status: AC | PRN

## 2016-05-12 NOTE — Telephone Encounter (Signed)
Please advise. I do not see on pts current med list to send in refill.

## 2016-05-12 NOTE — Telephone Encounter (Signed)
rx sent

## 2016-08-05 ENCOUNTER — Encounter: Payer: Self-pay | Admitting: Gastroenterology

## 2016-12-16 ENCOUNTER — Encounter: Payer: Self-pay | Admitting: Gastroenterology

## 2016-12-25 ENCOUNTER — Other Ambulatory Visit: Payer: Self-pay | Admitting: Obstetrics & Gynecology

## 2016-12-25 DIAGNOSIS — Z1231 Encounter for screening mammogram for malignant neoplasm of breast: Secondary | ICD-10-CM

## 2017-02-08 ENCOUNTER — Ambulatory Visit
Admission: RE | Admit: 2017-02-08 | Discharge: 2017-02-08 | Disposition: A | Discharge status: Home / Self Care | Payer: Commercial Managed Care - PPO | Source: Ambulatory Visit | Attending: Obstetrics & Gynecology | Admitting: Obstetrics & Gynecology

## 2017-02-08 DIAGNOSIS — Z1231 Encounter for screening mammogram for malignant neoplasm of breast: Secondary | ICD-10-CM

## 2017-02-08 LAB — HM MAMMOGRAPHY

## 2017-02-12 ENCOUNTER — Ambulatory Visit (AMBULATORY_SURGERY_CENTER): Payer: Self-pay | Admitting: *Deleted

## 2017-02-12 ENCOUNTER — Encounter: Payer: Self-pay | Admitting: Internal Medicine

## 2017-02-12 VITALS — Ht 62.0 in | Wt 166.4 lb

## 2017-02-12 DIAGNOSIS — Z86010 Personal history of colonic polyps: Secondary | ICD-10-CM

## 2017-02-12 MED ORDER — NA SULFATE-K SULFATE-MG SULF 17.5-3.13-1.6 GM/177ML PO SOLN: ORAL | 0 refills | Status: DC

## 2017-02-12 NOTE — Progress Notes (Signed)
No allergies to eggs or soy. No problems with anesthesia.  Pt given Emmi instructions for colonoscopy  No oxygen use  No diet drug use  

## 2017-02-14 ENCOUNTER — Other Ambulatory Visit: Payer: Self-pay | Admitting: Internal Medicine

## 2017-02-15 ENCOUNTER — Telehealth: Payer: Self-pay | Admitting: Gastroenterology

## 2017-02-15 NOTE — Telephone Encounter (Signed)
Spoke with patient. Offered the $50 coupon. Patient agrees to pay the $50. Coupon then called and given to [place].

## 2017-02-15 NOTE — Telephone Encounter (Signed)
Called patient. No answer, left message.

## 2017-02-17 ENCOUNTER — Encounter: Payer: Self-pay | Admitting: Gastroenterology

## 2017-03-01 ENCOUNTER — Ambulatory Visit (AMBULATORY_SURGERY_CENTER): Payer: Commercial Managed Care - PPO | Admitting: Gastroenterology

## 2017-03-01 ENCOUNTER — Encounter: Payer: Self-pay | Admitting: Gastroenterology

## 2017-03-01 VITALS — BP 116/73 | HR 60 | Temp 97.3°F | Resp 18 | Ht 62.0 in | Wt 166.0 lb

## 2017-03-01 DIAGNOSIS — Z86010 Personal history of colonic polyps: Secondary | ICD-10-CM

## 2017-03-01 DIAGNOSIS — Z800 Family history of malignant neoplasm of digestive organs: Secondary | ICD-10-CM

## 2017-03-01 DIAGNOSIS — D120 Benign neoplasm of cecum: Secondary | ICD-10-CM | POA: Diagnosis not present

## 2017-03-01 MED ORDER — SODIUM CHLORIDE 0.9 % IV SOLN: 500.0000 mL | INTRAVENOUS | Status: DC

## 2017-03-01 NOTE — Patient Instructions (Addendum)
**  Handouts given on polyps and diverticulosis** Avoid NSAIDS for 2 weeks. Tylenol is okay.   YOU HAD AN ENDOSCOPIC PROCEDURE TODAY AT [facility]:   Refer to the procedure report that was given to you for any specific questions about what was found during the examination.  If the procedure report does not answer your questions, please call your gastroenterologist to clarify.  If you requested that your care partner not be given the details of your procedure findings, then the procedure report has been included in a sealed envelope for you to review at your convenience later.  YOU SHOULD EXPECT: Some feelings of bloating in the abdomen. Passage of more gas than usual.  Walking can help get rid of the air that was put into your GI tract during the procedure and reduce the bloating. If you had a lower endoscopy (such as a colonoscopy or flexible sigmoidoscopy) you may notice spotting of blood in your stool or on the toilet paper. If you underwent a bowel prep for your procedure, you may not have a normal bowel movement for a few days.  Please Note:  You might notice some irritation and congestion in your nose or some drainage.  This is from the oxygen used during your procedure.  There is no need for concern and it should clear up in a day or so.  SYMPTOMS TO REPORT IMMEDIATELY:   Following lower endoscopy (colonoscopy or flexible sigmoidoscopy):  Excessive amounts of blood in the stool  Significant tenderness or worsening of abdominal pains  Swelling of the abdomen that is new, acute  Fever of 100F or higher  For urgent or emergent issues, a gastroenterologist can be reached at any hour by calling [phone].   DIET:  We do recommend a small meal at first, but then you may proceed to your regular diet.  Drink plenty of fluids but you should avoid alcoholic beverages for 24 hours.  ACTIVITY:  You should plan to take it easy for the rest of today and you should NOT DRIVE or  use heavy machinery until tomorrow (because of the sedation medicines used during the test).    FOLLOW UP: Our staff will call the number listed on your records the next business day following your procedure to check on you and address any questions or concerns that you may have regarding the information given to you following your procedure. If we do not reach you, we will leave a message.  However, if you are feeling well and you are not experiencing any problems, there is no need to return our call.  We will assume that you have returned to your regular daily activities without incident.  If any biopsies were taken you will be contacted by phone or by letter within the next 1-3 weeks.  Please call us at [phone] if you have not heard about the biopsies in 3 weeks.    SIGNATURES/CONFIDENTIALITY: You and/or your care partner have signed paperwork which will be entered into your electronic medical record.  These signatures attest to the fact that that the information above on your After Visit Summary has been reviewed and is understood.  Full responsibility of the confidentiality of this discharge information lies with you and/or your care-partner.

## 2017-03-01 NOTE — Op Note (Signed)
[facility] Patient Name: Yvonne Hall Procedure Date: 03/01/2017 7:41 AM MRN: [id number] Endoscopist: Ladene Artist , MD Age: 56 Referring MD:  Date of Birth: Apr 14, 1961 Gender: Female Account #: [id number] Procedure:                Colonoscopy Indications:              Surveillance: Personal history of adenomatous                            polyps on last colonoscopy 5 years ago. Family                            history of colon cancer. Medicines:                Monitored Anesthesia Care Procedure:                Pre-Anesthesia Assessment:                           - Prior to the procedure, a History and Physical                            was performed, and patient medications and                            allergies were reviewed. The patient's tolerance of                            previous anesthesia was also reviewed. The risks                            and benefits of the procedure and the sedation                            options and risks were discussed with the patient.                            All questions were answered, and informed consent                            was obtained. Prior Anticoagulants: The patient has                            taken no previous anticoagulant or antiplatelet                            agents. ASA Grade Assessment: II - A patient with                            mild systemic disease. After reviewing the risks                            and benefits, the patient was deemed in  satisfactory condition to undergo the procedure.                           After obtaining informed consent, the colonoscope                            was passed under direct vision. Throughout the                            procedure, the patient's blood pressure, pulse, and                            oxygen saturations were monitored continuously. The                            Colonoscope was introduced through the  anus and                            advanced to the the cecum, identified by                            appendiceal orifice and ileocecal valve. The                            colonoscopy was performed without difficulty. The                            patient tolerated the procedure well. The quality                            of the bowel preparation was excellent. The                            ileocecal valve, appendiceal orifice, and rectum                            were photographed. Scope In: 8:09:58 AM Scope Out: 8:24:22 AM Scope Withdrawal Time: 0 hours 11 minutes 59 seconds  Total Procedure Duration: 0 hours 14 minutes 24 seconds  Findings:                 The perianal and digital rectal examinations were                            normal.                           A 8 mm polyp was found in the cecum. The polyp was                            sessile. The polyp was removed with a hot snare.                            Resection and retrieval were complete.  A few small-mouthed diverticula were found in the                            sigmoid colon. There was no evidence of                            diverticular bleeding.                           The exam was otherwise normal throughout the                            examined colon. Complications:            No immediate complications. Estimated Blood Loss:     Estimated blood loss: none. Impression:               - One 8 mm polyp in the cecum, removed with a hot                            snare. Resected and retrieved.                           - Mild diverticulosis in the sigmoid colon. There                            was no evidence of diverticular bleeding. Recommendation:           - Patient has a contact number available for                            emergencies. The signs and symptoms of potential                            delayed complications were discussed with the                             patient. Return to normal activities tomorrow.                            Written discharge instructions were provided to the                            patient.                           - Resume previous diet.                           - Continue present medications.                           - No aspirin, ibuprofen, naproxen, or other                            non-steroidal anti-inflammatory drugs for 2 weeks  after polyp removal.                           - Await pathology results.                           - Repeat colonoscopy in 5 years for surveillance. Ladene Artist, MD 03/01/2017 8:32:24 AM This report has been signed electronically.

## 2017-03-01 NOTE — Progress Notes (Signed)
Called to room to assist during endoscopic procedure.  Patient ID and intended procedure confirmed with present staff. Received instructions for my participation in the procedure from the performing physician.  

## 2017-03-01 NOTE — Progress Notes (Signed)
Pt's states no medical or surgical changes since previsit or office visit. Maw  Only change pt got her flu shot. maw

## 2017-03-01 NOTE — Progress Notes (Signed)
To PACU, VSS. Report to [email or web address] 

## 2017-03-02 ENCOUNTER — Telehealth: Payer: Self-pay | Admitting: *Deleted

## 2017-03-02 NOTE — Telephone Encounter (Signed)
  Follow up Call-  Call back number 03/01/2017  Post procedure Call Back phone  # [phone] cell  Permission to leave phone message Yes  Some recent data might be hidden     Patient questions:  Do you have a fever, pain , or abdominal swelling? No. Pain Score  0 *  Have you tolerated food without any problems? No.  Have you been able to return to your normal activities? Yes.    Do you have any questions about your discharge instructions: Diet   No. Medications  No. Follow up visit  No.  Do you have questions or concerns about your Care? No.  Actions: * If pain score is 4 or above: No action needed, pain <4.

## 2017-03-02 NOTE — Telephone Encounter (Signed)
  Follow up Call-  Call back number 03/01/2017  Post procedure Call Back phone  # [phone] cell  Permission to leave phone message Yes  Some recent data might be hidden     Patient questions:  Do you have a fever, pain , or abdominal swelling? No. Pain Score  0 *  Have you tolerated food without any problems? Yes.    Have you been able to return to your normal activities? Yes.    Do you have any questions about your discharge instructions: Diet   No. Medications  No. Follow up visit  No.  Do you have questions or concerns about your Care? No.  Actions: * If pain score is 4 or above: No action needed, pain <4.

## 2017-03-11 NOTE — Assessment & Plan Note (Addendum)
Well controlled lipid panel  Continue daily statin Regular exercise and healthy diet encouraged

## 2017-03-11 NOTE — Patient Instructions (Addendum)
Your blood work looks great.  All other Health Maintenance issues reviewed.   All recommended immunizations and age-appropriate screenings are up-to-date or discussed.  No immunizations administered today.   An EKG was done today.  Medications reviewed and updated.  No changes recommended at this time.  Your prescription(s) have been submitted to your pharmacy. Please take as directed and contact our office if you believe you are having problem(s) with the medication(s).   Please followup in one year   Health Maintenance, Female Adopting a healthy lifestyle and getting preventive care can go a long way to promote health and wellness. Talk with your health care provider about what schedule of regular examinations is right for you. This is a good chance for you to check in with your provider about disease prevention and staying healthy. In between checkups, there are plenty of things you can do on your own. Experts have done a lot of research about which lifestyle changes and preventive measures are most likely to keep you healthy. Ask your health care provider for more information. Weight and diet Eat a healthy diet  Be sure to include plenty of vegetables, fruits, low-fat dairy products, and lean protein.  Do not eat a lot of foods high in solid fats, added sugars, or salt.  Get regular exercise. This is one of the most important things you can do for your health. ? Most adults should exercise for at least 150 minutes each week. The exercise should increase your heart rate and make you sweat (moderate-intensity exercise). ? Most adults should also do strengthening exercises at least twice a week. This is in addition to the moderate-intensity exercise.  Maintain a healthy weight  Body mass index (BMI) is a measurement that can be used to identify possible weight problems. It estimates body fat based on height and weight. Your health care provider can help determine your BMI and help you  achieve or maintain a healthy weight.  For females 26 years of age and older: ? A BMI below 18.5 is considered underweight. ? A BMI of 18.5 to 24.9 is normal. ? A BMI of 25 to 29.9 is considered overweight. ? A BMI of 30 and above is considered obese.  Watch levels of cholesterol and blood lipids  You should start having your blood tested for lipids and cholesterol at 57 years of age, then have this test every 5 years.  You may need to have your cholesterol levels checked more often if: ? Your lipid or cholesterol levels are high. ? You are older than 57 years of age. ? You are at high risk for heart disease.  Cancer screening Lung Cancer  Lung cancer screening is recommended for adults 40-53 years old who are at high risk for lung cancer because of a history of smoking.  A yearly low-dose CT scan of the lungs is recommended for people who: ? Currently smoke. ? Have quit within the past 15 years. ? Have at least a 30-pack-year history of smoking. A pack year is smoking an average of one pack of cigarettes a day for 1 year.  Yearly screening should continue until it has been 15 years since you quit.  Yearly screening should stop if you develop a health problem that would prevent you from having lung cancer treatment.  Breast Cancer  Practice breast self-awareness. This means understanding how your breasts normally appear and feel.  It also means doing regular breast self-exams. Let your health care provider know about any  no matter how small.  If you are in your 20s or 30s, you should have a clinical breast exam (CBE) by a health care provider every 1-3 years as part of a regular health exam.  If you are 40 or older, have a CBE every year. Also consider having a breast X-ray (mammogram) every year.  If you have a family history of breast cancer, talk to your health care provider about genetic screening.  If you are at high risk for breast cancer, talk to your health  care provider about having an MRI and a mammogram every year.  Breast cancer gene (BRCA) assessment is recommended for women who have family members with BRCA-related cancers. BRCA-related cancers include: ? Breast. ? Ovarian. ? Tubal. ? Peritoneal cancers.  Results of the assessment will determine the need for genetic counseling and BRCA1 and BRCA2 testing.  Cervical Cancer Your health care provider may recommend that you be screened regularly for cancer of the pelvic organs (ovaries, uterus, and vagina). This screening involves a pelvic examination, including checking for microscopic changes to the surface of your cervix (Pap test). You may be encouraged to have this screening done every 3 years, beginning at age 21.  For women ages 30-65, health care providers may recommend pelvic exams and Pap testing every 3 years, or they may recommend the Pap and pelvic exam, combined with testing for human papilloma virus (HPV), every 5 years. Some types of HPV increase your risk of cervical cancer. Testing for HPV may also be done on women of any age with unclear Pap test results.  Other health care providers may not recommend any screening for nonpregnant women who are considered low risk for pelvic cancer and who do not have symptoms. Ask your health care provider if a screening pelvic exam is right for you.  If you have had past treatment for cervical cancer or a condition that could lead to cancer, you need Pap tests and screening for cancer for at least 20 years after your treatment. If Pap tests have been discontinued, your risk factors (such as having a new sexual partner) need to be reassessed to determine if screening should resume. Some women have medical problems that increase the chance of getting cervical cancer. In these cases, your health care provider may recommend more frequent screening and Pap tests.  Colorectal Cancer  This type of cancer can be detected and often  prevented.  Routine colorectal cancer screening usually begins at 57 years of age and continues through 57 years of age.  Your health care provider may recommend screening at an earlier age if you have risk factors for colon cancer.  Your health care provider may also recommend using home test kits to check for hidden blood in the stool.  A small camera at the end of a tube can be used to examine your colon directly (sigmoidoscopy or colonoscopy). This is done to check for the earliest forms of colorectal cancer.  Routine screening usually begins at age 50.  Direct examination of the colon should be repeated every 5-10 years through 57 years of age. However, you may need to be screened more often if early forms of precancerous polyps or small growths are found.  Skin Cancer  Check your skin from head to toe regularly.  Tell your health care provider about any new moles or changes in moles, especially if there is a change in a mole's shape or color.  Also tell your health care provider if you   have a mole that is larger than the size of a pencil eraser.  Always use sunscreen. Apply sunscreen liberally and repeatedly throughout the day.  Protect yourself by wearing long sleeves, pants, a wide-brimmed hat, and sunglasses whenever you are outside.  Heart disease, diabetes, and high blood pressure  High blood pressure causes heart disease and increases the risk of stroke. High blood pressure is more likely to develop in: ? People who have blood pressure in the high end of the normal range (130-139/85-89 mm Hg). ? People who are overweight or obese. ? People who are African American.  If you are 18-39 years of age, have your blood pressure checked every 3-5 years. If you are 40 years of age or older, have your blood pressure checked every year. You should have your blood pressure measured twice-once when you are at a hospital or clinic, and once when you are not at a hospital or clinic.  Record the average of the two measurements. To check your blood pressure when you are not at a hospital or clinic, you can use: ? An automated blood pressure machine at a pharmacy. ? A home blood pressure monitor.  If you are between 55 years and 79 years old, ask your health care provider if you should take aspirin to prevent strokes.  Have regular diabetes screenings. This involves taking a blood sample to check your fasting blood sugar level. ? If you are at a normal weight and have a low risk for diabetes, have this test once every three years after 57 years of age. ? If you are overweight and have a high risk for diabetes, consider being tested at a younger age or more often. Preventing infection Hepatitis B  If you have a higher risk for hepatitis B, you should be screened for this virus. You are considered at high risk for hepatitis B if: ? You were born in a country where hepatitis B is common. Ask your health care provider which countries are considered high risk. ? Your parents were born in a high-risk country, and you have not been immunized against hepatitis B (hepatitis B vaccine). ? You have HIV or AIDS. ? You use needles to inject street drugs. ? You live with someone who has hepatitis B. ? You have had sex with someone who has hepatitis B. ? You get hemodialysis treatment. ? You take certain medicines for conditions, including cancer, organ transplantation, and autoimmune conditions.  Hepatitis C  Blood testing is recommended for: ? Everyone born from 1945 through 1965. ? Anyone with known risk factors for hepatitis C.  Sexually transmitted infections (STIs)  You should be screened for sexually transmitted infections (STIs) including gonorrhea and chlamydia if: ? You are sexually active and are younger than 57 years of age. ? You are older than 57 years of age and your health care provider tells you that you are at risk for this type of infection. ? Your sexual  activity has changed since you were last screened and you are at an increased risk for chlamydia or gonorrhea. Ask your health care provider if you are at risk.  If you do not have HIV, but are at risk, it may be recommended that you take a prescription medicine daily to prevent HIV infection. This is called pre-exposure prophylaxis (PrEP). You are considered at risk if: ? You are sexually active and do not regularly use condoms or know the HIV status of your partner(s). ? You take drugs by injection. ?   injection. ? You are sexually active with a partner who has HIV.  Talk with your health care provider about whether you are at high risk of being infected with HIV. If you choose to begin PrEP, you should first be tested for HIV. You should then be tested every 3 months for as long as you are taking PrEP. Pregnancy  If you are premenopausal and you may become pregnant, ask your health care provider about preconception counseling.  If you may become pregnant, take 400 to 800 micrograms (mcg) of folic acid every day.  If you want to prevent pregnancy, talk to your health care provider about birth control (contraception). Osteoporosis and menopause  Osteoporosis is a disease in which the bones lose minerals and strength with aging. This can result in serious bone fractures. Your risk for osteoporosis can be identified using a bone density scan.  If you are 94 years of age or older, or if you are at risk for osteoporosis and fractures, ask your health care provider if you should be screened.  Ask your health care provider whether you should take a calcium or vitamin D supplement to lower your risk for osteoporosis.  Menopause may have certain physical symptoms and risks.  Hormone replacement therapy may reduce some of these symptoms and risks. Talk to your health care provider about whether hormone replacement therapy is right for you. Follow these instructions at home:  Schedule regular health, dental,  and eye exams.  Stay current with your immunizations.  Do not use any tobacco products including cigarettes, chewing tobacco, or electronic cigarettes.  If you are pregnant, do not drink alcohol.  If you are breastfeeding, limit how much and how often you drink alcohol.  Limit alcohol intake to no more than 1 drink per day for nonpregnant women. One drink equals 12 ounces of beer, 5 ounces of wine, or 1 ounces of hard liquor.  Do not use street drugs.  Do not share needles.  Ask your health care provider for help if you need support or information about quitting drugs.  Tell your health care provider if you often feel depressed.  Tell your health care provider if you have ever been abused or do not feel safe at home. This information is not intended to replace advice given to you by your health care provider. Make sure you discuss any questions you have with your health care provider. Document Released: 11/10/2010 Document Revised: 10/03/2015 Document Reviewed: 01/29/2015 Elsevier Interactive Patient Education  [employer].

## 2017-03-11 NOTE — Assessment & Plan Note (Addendum)
dexa due - wants to defer until next year or the year after Taking fosamax - started in 2015 - should complete at least 5 years Walking for exercise Taking cal/vit d

## 2017-03-11 NOTE — Progress Notes (Signed)
Subjective:    Patient ID: Yvonne Hall, female    DOB: Aug 04, 1960, 56 y.o.   MRN: [id number]  HPI She is here fo  a physical exam.   She is doing well and has no complaints.   She had blood work done at work and brought a copy with her    Medications and allergies reviewed with patient and updated if appropriate.  Patient Active Problem List   Diagnosis Date Noted  . Actinic keratosis 03/22/2015  . Osteoporosis 04/04/2013  . 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 Right carotid bruit 03/21/2013  . Plantar fasciitis 06/01/2011  . HIP PAIN, RIGHT 03/21/2010  . 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 COLLES' FRACTURE 12/02/2009  . HYPERLIPIDEMIA 03/22/2007  . Carpal tunnel syndrome 03/22/2007  . 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 History of colonic polyps 03/22/2007    Current Outpatient Prescriptions on File Prior to Visit  Medication Sig Dispense Refill  . alendronate (FOSAMAX) 70 MG tablet TAKE ONE TABLET BY MOUTH ONCE A WEEK 12 tablet 0  . aspirin 81 MG tablet Take 81 mg by mouth daily.     Marland Kitchen b complex vitamins tablet Take 1 tablet by mouth daily.    . calcium carbonate (OS-CAL) 600 MG TABS Take 600 mg by mouth daily. With Vit D 800 units    . Estradiol (YUVAFEM) 10 MCG TABS vaginal tablet Place vaginally. 2-3 times a week    . Lecithin 400 MG CAPS Take 400 mg by mouth.     . Multiple Vitamin (MULTIVITAMIN) capsule Take 1 capsule by mouth daily.      . Omega-3 Fatty Acids (FISH OIL) 1000 MG CAPS Take by mouth daily.     . polyethylene glycol powder (GLYCOLAX/MIRALAX) powder Take 17 g by mouth 2 (two) times daily as needed. 3350 g 1  . pravastatin (PRAVACHOL) 20 MG tablet Take 1 tablet (20 mg total) by mouth at bedtime. 90 tablet 3   No current facility-administered medications on file prior to visit.     Past Medical History:  Diagnosis Date  . CTS (carpal tunnel syndrome)    overnight  splints  . FH: colonic polyps   . Heart murmur   . Hyperlipidemia   . Osteoporosis   . Plantar fasciitis 2012   partial tear on MRI ; Dr Steffanie Rainwater & Dr Stefanie Libel  . 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 Squamous cell carcinoma 6 & 10 / 2013   cryotherapy & topical cream ; Dr Tonia Brooms    Past Surgical History:  Procedure Laterality Date  . CATARACT EXTRACTION Bilateral 2016  . [place]   x2  . COLONOSCOPY W/ POLYPECTOMY  2003 & 2013   negative 2008,  Dr Fuller Plan, due 2018  . G3  P2    . RADIAL KERATOTOMY  1998  . retina pexy  2015   for pin hole retinla bleed; Dr Gershon Crane  . TUBAL LIGATION    . VITRECTOMY Right 2016  . wide resection     R shoulder pre malignant  lesion; Dr Tonia Brooms    Social History   Social History  . Marital status: Married    Spouse name: N/A  . Number of children: N/A  . Years of education: N/A   Occupational History  . Nurse [employer]   Social History Main Topics  . Smoking status: Never Smoker  .  Smokeless tobacco: Never Used     Comment: but second hand smoke exposure  . Alcohol use No  . Drug use: No  . Sexual activity: Not Asked   Other Topics Concern  . None   Social History Narrative   Gets reg exercise          Family History  Problem Relation Age of Onset  . Diabetes Mother   . Hypertension Mother   . Fibroids Mother        uterine  . Melanoma Father   . Colon polyps Father   . Hypertension Sister   . Skin cancer Sister        melanoma, basal cell   . Arthritis Maternal Aunt        Rheumatoid  . Hypertension Maternal Aunt   . Hyperlipidemia Paternal Aunt   . Osteopenia Paternal Aunt   . Cancer Paternal Aunt        melanoma,ovarian  . Heart attack Maternal Grandmother 76  . Hypertension Maternal Grandmother   . Hyperlipidemia Maternal Grandmother   . Diabetes Maternal Grandmother   . Stroke Maternal Grandmother 76  . Thyroid disease Maternal Grandmother   . Heart  attack Maternal Grandfather        MI in 98s  . Colon cancer Paternal Grandfather 46  . Esophageal cancer Neg Hx   . Pancreatic cancer Neg Hx   . Prostate cancer Neg Hx   . Rectal cancer Neg Hx   . Stomach cancer Neg Hx     Review of Systems  Constitutional: Negative for appetite change, chills, fatigue and fever.  Eyes: Negative for visual disturbance.  Respiratory: Negative for cough, shortness of breath and wheezing.   Cardiovascular: Negative for chest pain, palpitations (rare flip-flop) and leg swelling.  Gastrointestinal: Positive for constipation (controlled). Negative for abdominal pain, blood in stool, diarrhea and nausea.       Rare GERD  Genitourinary: Negative for dysuria and hematuria.  Musculoskeletal: Positive for arthralgias. Negative for back pain.  Skin: Negative for color change and rash.  Neurological: Positive for headaches (sinus headaches). Negative for light-headedness.  Psychiatric/Behavioral: Negative for dysphoric mood. The patient is not nervous/anxious.        Objective:   Vitals:   03/12/17 1521  BP: 136/82  Pulse: 74  Resp: 16  Temp: 98.4 F (36.9 C)  SpO2: 97%   Filed Weights   03/12/17 1521  Weight: 166 lb (75.3 kg)   Body mass index is 30.36 kg/m.  Wt Readings from Last 3 Encounters:  03/12/17 166 lb (75.3 kg)  03/01/17 166 lb (75.3 kg)  02/12/17 166 lb 6.4 oz (75.5 kg)     Physical Exam Constitutional: She appears well-developed and well-nourished. No distress.  HENT:  Head: Normocephalic and atraumatic.  Right Ear: External ear normal. Normal ear canal and TM Left Ear: External ear normal.  Normal ear canal and TM Mouth/Throat: Oropharynx is clear and moist.  Eyes: Conjunctivae and EOM are normal.  Neck: Neck supple. No tracheal deviation present. No thyromegaly present.  No carotid bruit  Cardiovascular: Normal rate, regular rhythm and normal heart sounds.   No murmur heard.  No edema. Pulmonary/Chest: Effort normal and  breath sounds normal. No respiratory distress. She has no wheezes. She has no rales.  Breast: deferred to Gyn Abdominal: Soft. She exhibits no distension. There is no tenderness.  Lymphadenopathy: She has no cervical adenopathy.  Skin: Skin is warm and dry. She is not diaphoretic.  Psychiatric: She  has a normal mood and affect. Her behavior is normal.        Assessment & Plan:   Physical exam: Screening blood work  ordered Immunizations  Discussed shingrix, other up to date Colonoscopy  Up to date    Mammogram  Up to date  Gyn   Up to date  Dexa  Due - deferred by patient  Eye exams  Up to date  EKG  Last done 2013-  Will do today  - Sinus at 70 bpm, normal EKG - no change compared to prior from 2013 Exercise  Walks regularly Weight  BMI elevated, overweight Skin   No concerns Substance abuse  none  See Problem List for Assessment and Plan of chronic medical problems.

## 2017-03-12 ENCOUNTER — Encounter: Payer: Self-pay | Admitting: Internal Medicine

## 2017-03-12 ENCOUNTER — Ambulatory Visit (INDEPENDENT_AMBULATORY_CARE_PROVIDER_SITE_OTHER): Payer: Commercial Managed Care - PPO | Admitting: Internal Medicine

## 2017-03-12 VITALS — BP 136/82 | HR 74 | Temp 98.4°F | Resp 16 | Ht 62.0 in | Wt 166.0 lb

## 2017-03-12 DIAGNOSIS — Z0000 Encounter for general adult medical examination without abnormal findings: Secondary | ICD-10-CM

## 2017-03-12 DIAGNOSIS — M810 Age-related osteoporosis without current pathological fracture: Secondary | ICD-10-CM

## 2017-03-12 DIAGNOSIS — E782 Mixed hyperlipidemia: Secondary | ICD-10-CM | POA: Diagnosis not present

## 2017-03-12 MED ORDER — ALENDRONATE SODIUM 70 MG PO TABS: 70.0000 mg | ORAL_TABLET | ORAL | 3 refills | Status: DC

## 2017-03-12 MED ORDER — PRAVASTATIN SODIUM 20 MG PO TABS: 20.0000 mg | ORAL_TABLET | Freq: Every day | ORAL | 3 refills | Status: DC

## 2017-03-15 ENCOUNTER — Encounter: Payer: Self-pay | Admitting: Internal Medicine

## 2017-03-19 ENCOUNTER — Encounter: Payer: Self-pay | Admitting: Gastroenterology

## 2017-03-22 ENCOUNTER — Encounter: Payer: BLUE CROSS/BLUE SHIELD | Admitting: Internal Medicine

## 2017-12-31 ENCOUNTER — Other Ambulatory Visit: Payer: Self-pay | Admitting: Obstetrics & Gynecology

## 2017-12-31 DIAGNOSIS — Z1231 Encounter for screening mammogram for malignant neoplasm of breast: Secondary | ICD-10-CM

## 2018-02-21 ENCOUNTER — Ambulatory Visit
Admission: RE | Admit: 2018-02-21 | Discharge: 2018-02-21 | Disposition: A | Discharge status: Home / Self Care | Payer: Commercial Managed Care - PPO | Source: Ambulatory Visit | Attending: Obstetrics & Gynecology | Admitting: Obstetrics & Gynecology

## 2018-02-21 DIAGNOSIS — Z1231 Encounter for screening mammogram for malignant neoplasm of breast: Secondary | ICD-10-CM

## 2018-02-21 LAB — HM MAMMOGRAPHY

## 2018-02-23 ENCOUNTER — Encounter: Payer: Self-pay | Admitting: Internal Medicine

## 2018-03-21 ENCOUNTER — Encounter: Payer: Commercial Managed Care - PPO | Admitting: Internal Medicine

## 2018-03-24 NOTE — Progress Notes (Signed)
Subjective:    Patient ID: Yvonne Hall, female    DOB: [birth year], 57 y.o.   MRN: [id number]  HPI She is here for a physical exam.   She has had a few episodes of reflux or dysphagia and wonders if it is related to the fosamax.  She started this in 2015.    She has no other concerns.  She had her blood work done at home.   Medications and allergies reviewed with patient and updated if appropriate.  Patient Active Problem List   Diagnosis Date Noted  . Actinic keratosis 03/22/2015  . Osteoporosis 04/04/2013  . 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 Plantar fasciitis 06/01/2011  . HIP PAIN, RIGHT 03/21/2010  . 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 COLLES' FRACTURE 12/02/2009  . HYPERLIPIDEMIA 03/22/2007  . Carpal tunnel syndrome 03/22/2007  . 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 History of colonic polyps 03/22/2007    Current Outpatient Medications on File Prior to Visit  Medication Sig Dispense Refill  . alendronate (FOSAMAX) 70 MG tablet Take 1 tablet (70 mg total) by mouth once a week. Take with a full glass of water on an empty stomach. 12 tablet 3  . aspirin 81 MG tablet Take 81 mg by mouth daily.     Marland Kitchen b complex vitamins tablet Take 1 tablet by mouth daily.    . calcium carbonate (OS-CAL) 600 MG TABS Take 600 mg by mouth daily. With Vit D 800 units    . Estradiol (YUVAFEM) 10 MCG TABS vaginal tablet Place vaginally. 2-3 times a week    . Lecithin 400 MG CAPS Take 400 mg by mouth.     . Multiple Vitamin (MULTIVITAMIN) capsule Take 1 capsule by mouth daily.      . Omega-3 Fatty Acids (FISH OIL) 1000 MG CAPS Take by mouth daily.     . polyethylene glycol powder (GLYCOLAX/MIRALAX) powder Take 17 g by mouth 2 (two) times daily as needed. 3350 g 1  . pravastatin (PRAVACHOL) 20 MG tablet Take 1 tablet (20 mg total) by mouth at bedtime. 90 tablet 3   No current facility-administered medications on  file prior to visit.     Past Medical History:  Diagnosis Date  . CTS (carpal tunnel syndrome)    overnight splints  . FH: colonic polyps   . Heart murmur   . Hyperlipidemia   . Osteoporosis   . Plantar fasciitis 2012   partial tear on MRI ; Dr Steffanie Rainwater & Dr Stefanie Libel  . 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 Squamous cell carcinoma 6 & 10 / 2013   cryotherapy & topical cream ; Dr Tonia Brooms    Past Surgical History:  Procedure Laterality Date  . CATARACT EXTRACTION Bilateral 2016  . [place]   x2  . COLONOSCOPY W/ POLYPECTOMY  2003 & 2013   negative 2008,  Dr Fuller Plan, due 2018  . [facility]  P2    . RADIAL KERATOTOMY  1998  . retina pexy  2015   for pin hole retinla bleed; Dr Gershon Crane  . TUBAL LIGATION    . VITRECTOMY Right 2016  . wide resection     R shoulder pre malignant  lesion; Dr Tonia Brooms    Social History   Socioeconomic History  . Marital status: Married    Spouse name: Not on file  . Number of children: Not on file  .  Years of education: Not on file  . Highest education level: Not on file  Occupational History  . Occupation: Optician, dispensing: [employer]  Social Needs  . Financial resource strain: Not on file  . Food insecurity:    Worry: Not on file    Inability: Not on file  . Transportation needs:    Medical: Not on file    Non-medical: Not on file  Tobacco Use  . Smoking status: Never Smoker  . Smokeless tobacco: Never Used  . Tobacco comment: but second hand smoke exposure  Substance and Sexual Activity  . Alcohol use: No  . Drug use: No  . Sexual activity: Not on file  Lifestyle  . Physical activity:    Days per week: Not on file    Minutes per session: Not on file  . Stress: Not on file  Relationships  . Social connections:    Talks on phone: Not on file    Gets together: Not on file    Attends religious service: Not on file    Active member of club or organization: Not on file    Attends  meetings of clubs or organizations: Not on file    Relationship status: Not on file  Other Topics Concern  . Not on file  Social History Narrative   Gets reg exercise          Family History  Problem Relation Age of Onset  . Diabetes Mother   . Hypertension Mother   . Fibroids Mother        uterine  . Melanoma Father   . Colon polyps Father   . Hypertension Sister   . Skin cancer Sister        melanoma, basal cell   . Arthritis Maternal Aunt        Rheumatoid  . Hypertension Maternal Aunt   . Hyperlipidemia Paternal Aunt   . Osteopenia Paternal Aunt   . Cancer Paternal Aunt        melanoma,ovarian  . Heart attack Maternal Grandmother 76  . Hypertension Maternal Grandmother   . Hyperlipidemia Maternal Grandmother   . Diabetes Maternal Grandmother   . Stroke Maternal Grandmother 76  . Thyroid disease Maternal Grandmother   . Heart attack Maternal Grandfather        MI in 74s  . Colon cancer Paternal Grandfather 63  . Esophageal cancer Neg Hx   . Pancreatic cancer Neg Hx   . Prostate cancer Neg Hx   . Rectal cancer Neg Hx   . Stomach cancer Neg Hx     Review of Systems  Constitutional: Negative for chills, fatigue and fever.  HENT: Positive for trouble swallowing (occ).   Eyes: Negative for visual disturbance.  Respiratory: Negative for cough, shortness of breath and wheezing.   Cardiovascular: Positive for palpitations (occasional) and leg swelling (after being on feet all day). Negative for chest pain.  Gastrointestinal: Positive for constipation (miralax ). Negative for abdominal pain, blood in stool, diarrhea and nausea.       Occ GERD  Genitourinary: Negative for dysuria and hematuria.  Musculoskeletal: Positive for arthralgias (mild). Negative for back pain.  Skin: Negative for color change and rash.  Neurological: Negative for light-headedness and headaches.  Psychiatric/Behavioral: Negative for dysphoric mood. The patient is not nervous/anxious.          Objective:   Vitals:   03/25/18 0751  BP: 136/84  Pulse: 94  Resp: 16  Temp:  98.4 F (36.9 C)  SpO2: 97%   Filed Weights   03/25/18 0751  Weight: 168 lb (76.2 kg)   Body mass index is 30.73 kg/m.  BP Readings from Last 3 Encounters:  03/25/18 136/84  03/12/17 136/82  03/01/17 116/73    Wt Readings from Last 3 Encounters:  03/25/18 168 lb (76.2 kg)  03/12/17 166 lb (75.3 kg)  03/01/17 166 lb (75.3 kg)     Physical Exam Constitutional: She appears well-developed and well-nourished. No distress.  HENT:  Head: Normocephalic and atraumatic.  Right Ear: External ear normal. Normal ear canal and TM Left Ear: External ear normal.  Normal ear canal and TM Mouth/Throat: Oropharynx is clear and moist.  Eyes: Conjunctivae and EOM are normal.  Neck: Neck supple. No tracheal deviation present. No thyromegaly present.  No carotid bruit  Cardiovascular: Normal rate, regular rhythm and normal heart sounds.   No murmur heard.  No edema. Pulmonary/Chest: Effort normal and breath sounds normal. No respiratory distress. She has no wheezes. She has no rales.  Breast: deferred to Gyn Abdominal: Soft. She exhibits no distension. There is no tenderness.  Lymphadenopathy: She has no cervical adenopathy.  Skin: Skin is warm and dry. She is not diaphoretic.  Psychiatric: She has a normal mood and affect. Her behavior is normal.        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   FU in one year

## 2018-03-24 NOTE — Patient Instructions (Addendum)
A bone density scan was ordered.  All other Health Maintenance issues reviewed.   All recommended immunizations and age-appropriate screenings are up-to-date or discussed.  No immunizations administered today.   Medications reviewed and updated.  Changes include :   none  Please followup in one year   Health Maintenance, Female Adopting a healthy lifestyle and getting preventive care can go a long way to promote health and wellness. Talk with your health care provider about what schedule of regular examinations is right for you. This is a good chance for you to check in with your provider about disease prevention and staying healthy. In between checkups, there are plenty of things you can do on your own. Experts have done a lot of research about which lifestyle changes and preventive measures are most likely to keep you healthy. Ask your health care provider for more information. Weight and diet Eat a healthy diet  Be sure to include plenty of vegetables, fruits, low-fat dairy products, and lean protein.  Do not eat a lot of foods high in solid fats, added sugars, or salt.  Get regular exercise. This is one of the most important things you can do for your health. ? Most adults should exercise for at least 150 minutes each week. The exercise should increase your heart rate and make you sweat (moderate-intensity exercise). ? Most adults should also do strengthening exercises at least twice a week. This is in addition to the moderate-intensity exercise.  Maintain a healthy weight  Body mass index (BMI) is a measurement that can be used to identify possible weight problems. It estimates body fat based on height and weight. Your health care provider can help determine your BMI and help you achieve or maintain a healthy weight.  For females 58 years of age and older: ? A BMI below 18.5 is considered underweight. ? A BMI of 18.5 to 24.9 is normal. ? A BMI of 25 to 29.9 is considered  overweight. ? A BMI of 30 and above is considered obese.  Watch levels of cholesterol and blood lipids  You should start having your blood tested for lipids and cholesterol at 58 years of age, then have this test every 5 years.  You may need to have your cholesterol levels checked more often if: ? Your lipid or cholesterol levels are high. ? You are older than 58 years of age. ? You are at high risk for heart disease.  Cancer screening Lung Cancer  Lung cancer screening is recommended for adults 68-60 years old who are at high risk for lung cancer because of a history of smoking.  A yearly low-dose CT scan of the lungs is recommended for people who: ? Currently smoke. ? Have quit within the past 15 years. ? Have at least a 30-pack-year history of smoking. A pack year is smoking an average of one pack of cigarettes a day for 1 year.  Yearly screening should continue until it has been 15 years since you quit.  Yearly screening should stop if you develop a health problem that would prevent you from having lung cancer treatment.  Breast Cancer  Practice breast self-awareness. This means understanding how your breasts normally appear and feel.  It also means doing regular breast self-exams. Let your health care provider know about any changes, no matter how small.  If you are in your 20s or 30s, you should have a clinical breast exam (CBE) by a health care provider every 1-3 years as part of  a regular health exam.  If you are 19 or older, have a CBE every year. Also consider having a breast X-ray (mammogram) every year.  If you have a family history of breast cancer, talk to your health care provider about genetic screening.  If you are at high risk for breast cancer, talk to your health care provider about having an MRI and a mammogram every year.  Breast cancer gene (BRCA) assessment is recommended for women who have family members with BRCA-related cancers. BRCA-related cancers  include: ? Breast. ? Ovarian. ? Tubal. ? Peritoneal cancers.  Results of the assessment will determine the need for genetic counseling and BRCA1 and BRCA2 testing.  Cervical Cancer Your health care provider may recommend that you be screened regularly for cancer of the pelvic organs (ovaries, uterus, and vagina). This screening involves a pelvic examination, including checking for microscopic changes to the surface of your cervix (Pap test). You may be encouraged to have this screening done every 3 years, beginning at age 34.  For women ages 4-65, health care providers may recommend pelvic exams and Pap testing every 3 years, or they may recommend the Pap and pelvic exam, combined with testing for human papilloma virus (HPV), every 5 years. Some types of HPV increase your risk of cervical cancer. Testing for HPV may also be done on women of any age with unclear Pap test results.  Other health care providers may not recommend any screening for nonpregnant women who are considered low risk for pelvic cancer and who do not have symptoms. Ask your health care provider if a screening pelvic exam is right for you.  If you have had past treatment for cervical cancer or a condition that could lead to cancer, you need Pap tests and screening for cancer for at least 20 years after your treatment. If Pap tests have been discontinued, your risk factors (such as having a new sexual partner) need to be reassessed to determine if screening should resume. Some women have medical problems that increase the chance of getting cervical cancer. In these cases, your health care provider may recommend more frequent screening and Pap tests.  Colorectal Cancer  This type of cancer can be detected and often prevented.  Routine colorectal cancer screening usually begins at 58 years of age and continues through 58 years of age.  Your health care provider may recommend screening at an earlier age if you have risk factors  for colon cancer.  Your health care provider may also recommend using home test kits to check for hidden blood in the stool.  A small camera at the end of a tube can be used to examine your colon directly (sigmoidoscopy or colonoscopy). This is done to check for the earliest forms of colorectal cancer.  Routine screening usually begins at age 37.  Direct examination of the colon should be repeated every 5-10 years through 58 years of age. However, you may need to be screened more often if early forms of precancerous polyps or small growths are found.  Skin Cancer  Check your skin from head to toe regularly.  Tell your health care provider about any new moles or changes in moles, especially if there is a change in a mole's shape or color.  Also tell your health care provider if you have a mole that is larger than the size of a pencil eraser.  Always use sunscreen. Apply sunscreen liberally and repeatedly throughout the day.  Protect yourself by wearing long sleeves,  pants, a wide-brimmed hat, and sunglasses whenever you are outside.  Heart disease, diabetes, and high blood pressure  High blood pressure causes heart disease and increases the risk of stroke. High blood pressure is more likely to develop in: ? People who have blood pressure in the high end of the normal range (130-139/85-89 mm Hg). ? People who are overweight or obese. ? People who are African American.  If you are 18-39 years of age, have your blood pressure checked every 3-5 years. If you are 40 years of age or older, have your blood pressure checked every year. You should have your blood pressure measured twice-once when you are at a hospital or clinic, and once when you are not at a hospital or clinic. Record the average of the two measurements. To check your blood pressure when you are not at a hospital or clinic, you can use: ? An automated blood pressure machine at a pharmacy. ? A home blood pressure monitor.  If  you are between 55 years and 79 years old, ask your health care provider if you should take aspirin to prevent strokes.  Have regular diabetes screenings. This involves taking a blood sample to check your fasting blood sugar level. ? If you are at a normal weight and have a low risk for diabetes, have this test once every three years after 58 years of age. ? If you are overweight and have a high risk for diabetes, consider being tested at a younger age or more often. Preventing infection Hepatitis B  If you have a higher risk for hepatitis B, you should be screened for this virus. You are considered at high risk for hepatitis B if: ? You were born in a country where hepatitis B is common. Ask your health care provider which countries are considered high risk. ? Your parents were born in a high-risk country, and you have not been immunized against hepatitis B (hepatitis B vaccine). ? You have HIV or AIDS. ? You use needles to inject street drugs. ? You live with someone who has hepatitis B. ? You have had sex with someone who has hepatitis B. ? You get hemodialysis treatment. ? You take certain medicines for conditions, including cancer, organ transplantation, and autoimmune conditions.  Hepatitis C  Blood testing is recommended for: ? Everyone born from 1945 through 1965. ? Anyone with known risk factors for hepatitis C.  Sexually transmitted infections (STIs)  You should be screened for sexually transmitted infections (STIs) including gonorrhea and chlamydia if: ? You are sexually active and are younger than 58 years of age. ? You are older than 58 years of age and your health care provider tells you that you are at risk for this type of infection. ? Your sexual activity has changed since you were last screened and you are at an increased risk for chlamydia or gonorrhea. Ask your health care provider if you are at risk.  If you do not have HIV, but are at risk, it may be recommended  that you take a prescription medicine daily to prevent HIV infection. This is called pre-exposure prophylaxis (PrEP). You are considered at risk if: ? You are sexually active and do not regularly use condoms or know the HIV status of your partner(s). ? You take drugs by injection. ? You are sexually active with a partner who has HIV.  Talk with your health care provider about whether you are at high risk of being infected with HIV. If you   choose to begin PrEP, you should first be tested for HIV. You should then be tested every 3 months for as long as you are taking PrEP. Pregnancy  If you are premenopausal and you may become pregnant, ask your health care provider about preconception counseling.  If you may become pregnant, take 400 to 800 micrograms (mcg) of folic acid every day.  If you want to prevent pregnancy, talk to your health care provider about birth control (contraception). Osteoporosis and menopause  Osteoporosis is a disease in which the bones lose minerals and strength with aging. This can result in serious bone fractures. Your risk for osteoporosis can be identified using a bone density scan.  If you are 56 years of age or older, or if you are at risk for osteoporosis and fractures, ask your health care provider if you should be screened.  Ask your health care provider whether you should take a calcium or vitamin D supplement to lower your risk for osteoporosis.  Menopause may have certain physical symptoms and risks.  Hormone replacement therapy may reduce some of these symptoms and risks. Talk to your health care provider about whether hormone replacement therapy is right for you. Follow these instructions at home:  Schedule regular health, dental, and eye exams.  Stay current with your immunizations.  Do not use any tobacco products including cigarettes, chewing tobacco, or electronic cigarettes.  If you are pregnant, do not drink alcohol.  If you are  breastfeeding, limit how much and how often you drink alcohol.  Limit alcohol intake to no more than 1 drink per day for nonpregnant women. One drink equals 12 ounces of beer, 5 ounces of wine, or 1 ounces of hard liquor.  Do not use street drugs.  Do not share needles.  Ask your health care provider for help if you need support or information about quitting drugs.  Tell your health care provider if you often feel depressed.  Tell your health care provider if you have ever been abused or do not feel safe at home. This information is not intended to replace advice given to you by your health care provider. Make sure you discuss any questions you have with your health care provider. Document Released: 11/10/2010 Document Revised: 10/03/2015 Document Reviewed: 01/29/2015 Elsevier Interactive Patient Education  [employer].

## 2018-03-24 NOTE — Assessment & Plan Note (Addendum)
Started fosamax in 2015 Taking calcium and vitamin d daily Walking for exercise Last dexa 2014 Having some reflux/dyaphgia - may need to stop medication dexa ordered

## 2018-03-25 ENCOUNTER — Encounter: Payer: Self-pay | Admitting: Internal Medicine

## 2018-03-25 ENCOUNTER — Ambulatory Visit (INDEPENDENT_AMBULATORY_CARE_PROVIDER_SITE_OTHER): Payer: Commercial Managed Care - PPO | Admitting: Internal Medicine

## 2018-03-25 ENCOUNTER — Ambulatory Visit (INDEPENDENT_AMBULATORY_CARE_PROVIDER_SITE_OTHER)
Admission: RE | Admit: 2018-03-25 | Discharge: 2018-03-25 | Disposition: A | Discharge status: Home / Self Care | Payer: Commercial Managed Care - PPO | Source: Ambulatory Visit | Attending: Internal Medicine | Admitting: Internal Medicine

## 2018-03-25 VITALS — BP 136/84 | HR 94 | Temp 98.4°F | Resp 16 | Ht 62.0 in | Wt 168.0 lb

## 2018-03-25 DIAGNOSIS — M810 Age-related osteoporosis without current pathological fracture: Secondary | ICD-10-CM | POA: Diagnosis not present

## 2018-03-25 DIAGNOSIS — E782 Mixed hyperlipidemia: Secondary | ICD-10-CM | POA: Diagnosis not present

## 2018-03-25 DIAGNOSIS — Z0000 Encounter for general adult medical examination without abnormal findings: Secondary | ICD-10-CM

## 2018-03-25 MED ORDER — PRAVASTATIN SODIUM 20 MG PO TABS: 20.0000 mg | ORAL_TABLET | Freq: Every day | ORAL | 3 refills | Status: DC

## 2018-03-25 NOTE — Assessment & Plan Note (Signed)
Check lipid panel  Continue daily statin- pravastatin Regular exercise and healthy diet encouraged

## 2018-04-03 ENCOUNTER — Encounter: Payer: Self-pay | Admitting: Internal Medicine

## 2018-04-11 ENCOUNTER — Ambulatory Visit: Payer: Self-pay | Admitting: *Deleted

## 2018-04-11 NOTE — Telephone Encounter (Signed)
Pt calling in response to Dr. Quay Burow last MyChart message of 04/10/18.  Has additional questions:   States she did call her insurance and they were unable to provide a cost for infusion.  Pt is asking for a "Ballpark figure." Also questioning if Reclast will be beneficial as only saw "miminal improvement with Fosamax. "  Reviewed message from Dr. Quay Burow.  Asking if infusion is done in a clinic or outpatient facility and what that cost may be. Additional questions [place] unable to answer.  Please advise:  Before 5pm   [phone]  After 5 pm [phone]

## 2018-04-13 NOTE — Telephone Encounter (Signed)
LVM for pt to call back in regards.  

## 2018-04-13 NOTE — Telephone Encounter (Signed)
Patient called in to speak about this. Patient has been informed this is something that is done at the hospital and we do not know the charge for it. She was given the number to short stay billing to see if they could help her with that information.

## 2018-04-15 ENCOUNTER — Telehealth: Payer: Self-pay | Admitting: Internal Medicine

## 2018-04-15 NOTE — Telephone Encounter (Signed)
Copied from [place] [id number]. Topic: Quick Communication - See Telephone Encounter >> Apr 15, 2018  2:48 PM Blase Mess A wrote: CRM for notification. See Telephone encounter for: 04/15/18.  Patient is calling back regarding the infusion per Dr Quay Burow. The short stay billing  The insurance states that they are unable to let her know the cost of the infusion without a procedure or infusion Code?  Also, that the patient is wanting to know is there a [facility] for her to get this procedure done rather going to the hospital in an office setting that would be let expensive than the hospital?  Please advise. [phone]

## 2018-04-19 ENCOUNTER — Encounter: Payer: Self-pay | Admitting: Internal Medicine

## 2018-04-19 ENCOUNTER — Other Ambulatory Visit: Payer: Self-pay | Admitting: Internal Medicine

## 2018-04-19 NOTE — Telephone Encounter (Signed)
Responded to pt through [employer]

## 2018-04-28 ENCOUNTER — Other Ambulatory Visit: Payer: Self-pay | Admitting: Internal Medicine

## 2018-04-28 NOTE — Telephone Encounter (Signed)
We will need to do prior auth next month - she is thinking about having it in Feb

## 2019-01-03 ENCOUNTER — Other Ambulatory Visit: Payer: Self-pay | Admitting: Obstetrics & Gynecology

## 2019-01-03 DIAGNOSIS — Z1231 Encounter for screening mammogram for malignant neoplasm of breast: Secondary | ICD-10-CM

## 2019-02-27 ENCOUNTER — Ambulatory Visit
Admission: RE | Admit: 2019-02-27 | Discharge: 2019-02-27 | Disposition: A | Discharge status: Home / Self Care | Payer: Commercial Managed Care - PPO | Source: Ambulatory Visit | Attending: Obstetrics & Gynecology | Admitting: Obstetrics & Gynecology

## 2019-02-27 ENCOUNTER — Other Ambulatory Visit: Payer: Self-pay

## 2019-02-27 DIAGNOSIS — Z1231 Encounter for screening mammogram for malignant neoplasm of breast: Secondary | ICD-10-CM

## 2019-02-27 LAB — HM MAMMOGRAPHY

## 2019-03-03 ENCOUNTER — Encounter: Payer: Self-pay | Admitting: Internal Medicine

## 2019-03-26 NOTE — Progress Notes (Signed)
Subjective:    Patient ID: Yvonne Hall, female    DOB: 06-29-1960, 58 y.o.   MRN: [id number]  HPI She is here for a physical exam.    She brought her labs here - she wonders if her statin needs to be increased. Her LDL is 133.    She has no other concerns.   Medications and allergies reviewed with patient and updated if appropriate.  Patient Active Problem List   Diagnosis Date Noted  . Actinic keratosis 03/22/2015  . Osteoporosis 04/04/2013  . 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 Plantar fasciitis 06/01/2011  . HIP PAIN, RIGHT 03/21/2010  . 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 COLLES' FRACTURE 12/02/2009  . HYPERLIPIDEMIA 03/22/2007  . Carpal tunnel syndrome 03/22/2007  . 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 History of colonic polyps 03/22/2007    Current Outpatient Medications on File Prior to Visit  Medication Sig Dispense Refill  . aspirin 81 MG tablet Take 81 mg by mouth daily.     Marland Kitchen b complex vitamins tablet Take 1 tablet by mouth daily.    . calcium carbonate (OS-CAL) 600 MG TABS Take 600 mg by mouth daily. With Vit D 800 units    . Estradiol (YUVAFEM) 10 MCG TABS vaginal tablet Place vaginally. 2-3 times a week    . Lecithin 400 MG CAPS Take 400 mg by mouth.     . Multiple Vitamin (MULTIVITAMIN) capsule Take 1 capsule by mouth daily.      . Omega-3 Fatty Acids (FISH OIL) 1000 MG CAPS Take by mouth daily.     . polyethylene glycol powder (GLYCOLAX/MIRALAX) powder Take 17 g by mouth 2 (two) times daily as needed. 3350 g 1   No current facility-administered medications on file prior to visit.     Past Medical History:  Diagnosis Date  . CTS (carpal tunnel syndrome)    overnight splints  . FH: colonic polyps   . Heart murmur   . Hyperlipidemia   . Osteoporosis   . Plantar fasciitis 2012   partial tear on MRI ; Dr Steffanie Rainwater & Dr Stefanie Libel  . PPD positive, treated 1994   6  months INH  . Squamous cell carcinoma 6 & 10 / 2013   cryotherapy & topical cream ; Dr Tonia Brooms    Past Surgical History:  Procedure Laterality Date  . CATARACT EXTRACTION Bilateral 2016  . [place]   x2  . COLONOSCOPY W/ POLYPECTOMY  2003 & 2013   negative 2008,  Dr Fuller Plan, due 2018  . G3  P2    . RADIAL KERATOTOMY  1998  . retina pexy  2015   for pin hole retinla bleed; Dr Gershon Crane  . TUBAL LIGATION    . VITRECTOMY Right 2016  . wide resection     R shoulder pre malignant  lesion; Dr Tonia Brooms    Social History   Socioeconomic History  . Marital status: Married    Spouse name: Not on file  . Number of children: Not on file  . Years of education: Not on file  . Highest education level: Not on file  Occupational History  . Occupation: Optician, dispensing: [employer]  Social Needs  . Financial resource strain: Not on file  . Food insecurity    Worry: Not on file    Inability: Not on file  . Transportation needs  Medical: Not on file    Non-medical: Not on file  Tobacco Use  . Smoking status: Never Smoker  . Smokeless tobacco: Never Used  . Tobacco comment: but second hand smoke exposure  Substance and Sexual Activity  . Alcohol use: No  . Drug use: No  . Sexual activity: Not on file  Lifestyle  . Physical activity    Days per week: Not on file    Minutes per session: Not on file  . Stress: Not on file  Relationships  . Social Herbalist on phone: Not on file    Gets together: Not on file    Attends religious service: Not on file    Active member of club or organization: Not on file    Attends meetings of clubs or organizations: Not on file    Relationship status: Not on file  Other Topics Concern  . Not on file  Social History Narrative   Gets reg exercise          Family History  Problem Relation Age of Onset  . Diabetes Mother   . Hypertension Mother   . Fibroids Mother        uterine  . Melanoma  Father   . Colon polyps Father   . Hypertension Sister   . Skin cancer Sister        melanoma, basal cell   . Arthritis Maternal Aunt        Rheumatoid  . Hypertension Maternal Aunt   . Hyperlipidemia Paternal Aunt   . Osteopenia Paternal Aunt   . Cancer Paternal Aunt        melanoma,ovarian  . Heart attack Maternal Grandmother 76  . Hypertension Maternal Grandmother   . Hyperlipidemia Maternal Grandmother   . Diabetes Maternal Grandmother   . Stroke Maternal Grandmother 76  . Thyroid disease Maternal Grandmother   . Heart attack Maternal Grandfather        MI in 92s  . Colon cancer Paternal Grandfather 8  . Esophageal cancer Neg Hx   . Pancreatic cancer Neg Hx   . Prostate cancer Neg Hx   . Rectal cancer Neg Hx   . Stomach cancer Neg Hx     Review of Systems  Constitutional: Negative for chills and fever.  Eyes: Positive for visual disturbance.  Respiratory: Negative for cough, shortness of breath and wheezing.   Cardiovascular: Positive for leg swelling (occ, mild after being on feet all day). Negative for chest pain and palpitations.  Gastrointestinal: Negative for abdominal pain, blood in stool, constipation, diarrhea and nausea.       Occ GERD - Tums  Genitourinary: Negative for dysuria and hematuria.  Musculoskeletal: Positive for arthralgias (mild). Negative for back pain.  Skin: Negative for color change and rash.  Neurological: Positive for headaches (sinus, occ). Negative for light-headedness.  Psychiatric/Behavioral: Negative for dysphoric mood. The patient is not nervous/anxious.        Objective:   Vitals:   03/27/19 0856  BP: 122/80  Pulse: 81  Resp: 16  Temp: 98.8 F (37.1 C)  SpO2: 96%   Filed Weights   03/27/19 0856  Weight: 164 lb (74.4 kg)   Body mass index is 30 kg/m.  BP Readings from Last 3 Encounters:  03/27/19 122/80  03/25/18 136/84  03/12/17 136/82    Wt Readings from Last 3 Encounters:  03/27/19 164 lb (74.4 kg)   03/25/18 168 lb (76.2 kg)  03/12/17 166 lb (75.3 kg)  Physical Exam Constitutional: She appears well-developed and well-nourished. No distress.  HENT:  Head: Normocephalic and atraumatic.  Right Ear: External ear normal. Normal ear canal and TM Left Ear: External ear normal.  Normal ear canal and TM Mouth/Throat: Oropharynx is clear and moist.  Eyes: Conjunctivae and EOM are normal.  Neck: Neck supple. No tracheal deviation present. No thyromegaly present.  No carotid bruit  Cardiovascular: Normal rate, regular rhythm and normal heart sounds.   No murmur heard.  No edema. Pulmonary/Chest: Effort normal and breath sounds normal. No respiratory distress. She has no wheezes. She has no rales.  Breast: deferred   Abdominal: Soft. She exhibits no distension. There is no tenderness.  Lymphadenopathy: She has no cervical adenopathy.  Skin: Skin is warm and dry. She is not diaphoretic.  Psychiatric: She has a normal mood and affect. Her behavior is normal.        Assessment & Plan:   Physical exam: Screening blood work    reviewed Immunizations  tdap up to date, flu up to date Colonoscopy  Up to date  Mammogram  Up to date  Gyn  Up to date  Dexa  Up to date  Eye exams  Up to date  Exercise  Very active - walking, yard work [employer]   Working on [employer] loss Substance abuse  none  See Problem List for Assessment and Plan of chronic medical problems.   FU in one year

## 2019-03-26 NOTE — Patient Instructions (Addendum)
All other Health Maintenance issues reviewed.   All recommended immunizations and age-appropriate screenings are up-to-date or discussed.  No immunization administered today.   Medications reviewed and updated.  Changes include :   Increased Pravastatin 40 mg daily  Your prescription(s) have been submitted to your pharmacy. Please take as directed and contact our office if you believe you are having problem(s) with the medication(s).   Please followup in 1 year    Health Maintenance, Female Adopting a healthy lifestyle and getting preventive care are important in promoting health and wellness. Ask your health care provider about:  The right schedule for you to have regular tests and exams.  Things you can do on your own to prevent diseases and keep yourself healthy. What should I know about diet, weight, and exercise? Eat a healthy diet   Eat a diet that includes plenty of vegetables, fruits, low-fat dairy products, and lean protein.  Do not eat a lot of foods that are high in solid fats, added sugars, or sodium. Maintain a healthy weight Body mass index (BMI) is used to identify weight problems. It estimates body fat based on height and weight. Your health care provider can help determine your BMI and help you achieve or maintain a healthy weight. Get regular exercise Get regular exercise. This is one of the most important things you can do for your health. Most adults should:  Exercise for at least 150 minutes each week. The exercise should increase your heart rate and make you sweat (moderate-intensity exercise).  Do strengthening exercises at least twice a week. This is in addition to the moderate-intensity exercise.  Spend less time sitting. Even light physical activity can be beneficial. Watch cholesterol and blood lipids Have your blood tested for lipids and cholesterol at 59 years of age, then have this test every 5 years. Have your cholesterol levels checked more  often if:  Your lipid or cholesterol levels are high.  You are older than 59 years of age.  You are at high risk for heart disease. What should I know about cancer screening? Depending on your health history and family history, you may need to have cancer screening at various ages. This may include screening for:  Breast cancer.  Cervical cancer.  Colorectal cancer.  Skin cancer.  Lung cancer. What should I know about heart disease, diabetes, and high blood pressure? Blood pressure and heart disease  High blood pressure causes heart disease and increases the risk of stroke. This is more likely to develop in people who have high blood pressure readings, are of African descent, or are overweight.  Have your blood pressure checked: ? Every 3-5 years if you are 59-25 years of age. ? Every year if you are 46 years old or older. Diabetes Have regular diabetes screenings. This checks your fasting blood sugar level. Have the screening done:  Once every three years after age 18 if you are at a normal weight and have a low risk for diabetes.  More often and at a younger age if you are overweight or have a high risk for diabetes. What should I know about preventing infection? Hepatitis B If you have a higher risk for hepatitis B, you should be screened for this virus. Talk with your health care provider to find out if you are at risk for hepatitis B infection. Hepatitis C Testing is recommended for:  Everyone born from [birth year] through 1966.  Anyone with known risk factors for hepatitis C. Sexually transmitted  infections (STIs)  Get screened for STIs, including gonorrhea and chlamydia, if: ? You are sexually active and are younger than 59 years of age. ? You are older than 59 years of age and your health care provider tells you that you are at risk for this type of infection. ? Your sexual activity has changed since you were last screened, and you are at increased risk for chlamydia  or gonorrhea. Ask your health care provider if you are at risk.  Ask your health care provider about whether you are at high risk for HIV. Your health care provider may recommend a prescription medicine to help prevent HIV infection. If you choose to take medicine to prevent HIV, you should first get tested for HIV. You should then be tested every 3 months for as long as you are taking the medicine. Pregnancy  If you are about to stop having your period (premenopausal) and you may become pregnant, seek counseling before you get pregnant.  Take 400 to 800 micrograms (mcg) of folic acid every day if you become pregnant.  Ask for birth control (contraception) if you want to prevent pregnancy. Osteoporosis and menopause Osteoporosis is a disease in which the bones lose minerals and strength with aging. This can result in bone fractures. If you are 43 years old or older, or if you are at risk for osteoporosis and fractures, ask your health care provider if you should:  Be screened for bone loss.  Take a calcium or vitamin D supplement to lower your risk of fractures.  Be given hormone replacement therapy (HRT) to treat symptoms of menopause. Follow these instructions at home: Lifestyle  Do not use any products that contain nicotine or tobacco, such as cigarettes, e-cigarettes, and chewing tobacco. If you need help quitting, ask your health care provider.  Do not use street drugs.  Do not share needles.  Ask your health care provider for help if you need support or information about quitting drugs. Alcohol use  Do not drink alcohol if: ? Your health care provider tells you not to drink. ? You are pregnant, may be pregnant, or are planning to become pregnant.  If you drink alcohol: ? Limit how much you use to 0-1 drink a day. ? Limit intake if you are breastfeeding.  Be aware of how much alcohol is in your drink. In the [place] one drink equals one 12 oz bottle of beer (355 mL), one 5 oz  glass of wine (148 mL), or one 1 oz glass of hard liquor (44 mL). General instructions  Schedule regular health, dental, and eye exams.  Stay current with your vaccines.  Tell your health care provider if: ? You often feel depressed. ? You have ever been abused or do not feel safe at home. Summary  Adopting a healthy lifestyle and getting preventive care are important in promoting health and wellness.  Follow your health care provider's instructions about healthy diet, exercising, and getting tested or screened for diseases.  Follow your health care provider's instructions on monitoring your cholesterol and blood pressure. This information is not intended to replace advice given to you by your health care provider. Make sure you discuss any questions you have with your health care provider. Document Released: 11/10/2010 Document Revised: 04/20/2018 Document Reviewed: 04/20/2018 Elsevier Patient Education  2020 [employer].

## 2019-03-27 ENCOUNTER — Encounter: Payer: Self-pay | Admitting: Internal Medicine

## 2019-03-27 ENCOUNTER — Ambulatory Visit (INDEPENDENT_AMBULATORY_CARE_PROVIDER_SITE_OTHER): Payer: Commercial Managed Care - PPO | Admitting: Internal Medicine

## 2019-03-27 ENCOUNTER — Other Ambulatory Visit: Payer: Self-pay

## 2019-03-27 VITALS — BP 122/80 | HR 81 | Temp 98.8°F | Resp 16 | Ht 62.0 in | Wt 164.0 lb

## 2019-03-27 DIAGNOSIS — C4492 Squamous cell carcinoma of skin, unspecified: Secondary | ICD-10-CM

## 2019-03-27 DIAGNOSIS — Z0000 Encounter for general adult medical examination without abnormal findings: Secondary | ICD-10-CM | POA: Diagnosis not present

## 2019-03-27 DIAGNOSIS — M810 Age-related osteoporosis without current pathological fracture: Secondary | ICD-10-CM

## 2019-03-27 DIAGNOSIS — E782 Mixed hyperlipidemia: Secondary | ICD-10-CM | POA: Diagnosis not present

## 2019-03-27 MED ORDER — PRAVASTATIN SODIUM 40 MG PO TABS: 40.0000 mg | ORAL_TABLET | Freq: Every day | ORAL | 3 refills | Status: DC

## 2019-03-27 NOTE — Assessment & Plan Note (Addendum)
Reviewed recent labs LDL 133 Will increase pravastatin to 40 mg daily She will have her lipid panel, hepatic function rechecked at work in 2 months

## 2019-03-27 NOTE — Assessment & Plan Note (Signed)
Sees derm annually 

## 2019-03-27 NOTE — Assessment & Plan Note (Signed)
Completed 5 years of fosamax  2015 - 2020 Walking Calcium and vitamin d daily

## 2019-08-02 ENCOUNTER — Encounter: Payer: Self-pay | Admitting: Internal Medicine

## 2019-08-04 ENCOUNTER — Encounter: Payer: Self-pay | Admitting: Internal Medicine

## 2019-10-16 ENCOUNTER — Encounter: Payer: Self-pay | Admitting: Nurse Practitioner

## 2019-10-16 ENCOUNTER — Ambulatory Visit (INDEPENDENT_AMBULATORY_CARE_PROVIDER_SITE_OTHER): Payer: Commercial Managed Care - PPO | Admitting: Nurse Practitioner

## 2019-10-16 VITALS — BP 110/70 | HR 82 | Ht 62.0 in | Wt 169.0 lb

## 2019-10-16 DIAGNOSIS — R1310 Dysphagia, unspecified: Secondary | ICD-10-CM

## 2019-10-16 NOTE — Patient Instructions (Signed)
If you are age 60 or older, your body mass index should be between 23-30. Your Body mass index is 30.91 kg/m. If this is out of the aforementioned range listed, please consider follow up with your Primary Care Provider.  If you are age 71 or younger, your body mass index should be between 19-25. Your Body mass index is 30.91 kg/m. If this is out of the aformentioned range listed, please consider follow up with your Primary Care Provider.    You have been scheduled for an endoscopy. Please follow written instructions given to you at your visit today. If you use inhalers (even only as needed), please bring them with you on the day of your procedure.  Due to recent changes in healthcare laws, you may see the results of your imaging and laboratory studies on MyChart before your provider has had a chance to review them.  We understand that in some cases there may be results that are confusing or concerning to you. Not all laboratory results come back in the same time frame and the provider may be waiting for multiple results in order to interpret others.  Please give [place] 48 hours in order for your provider to thoroughly review all the results before contacting the office for clarification of your results.

## 2019-10-16 NOTE — Progress Notes (Signed)
Reviewed and agree with management plan.  Enrico Eaddy T. Darren Nodal, MD FACG [facility]  

## 2019-10-16 NOTE — Progress Notes (Signed)
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 Vomiting impacted food. No associated weight loss.  --Rule out reflux esophagitis. Rule out stricture. Took Fosamax for years.  --For further evaluation will schedule for EGD. The risks and benefits of EGD were discussed and the patient agrees to proceed.  --Prefers to await EGD results before starting reflux medications. Currently taking Tums as needed --Doesn't use much caffeine. Anti-reflux measures discussed.  --Advised patient to eat small bites, chew well with liquids in between bites to avoid food impaction.  # Chronic constipation,  --Manages well with Miralax.   HPI:     Chief Complaint:  dysphagia   59 year old female known to Dr. Fuller Plan for history of adenomatous colon polyps.. A year ago patient began having intermittent problems swallowing solids. In the last 6 months she has had solid food dysphagia with nearly every meal. She tries to push food through esophagus with water but during episodes of dysphagia the water comes back up. Otherwise,  liquids aren't a problem. She has had a couple of episodes of severe dysphagia nearly prompting ED viist. Memorial Day a piece of hamburger got lodged, she eventually vomited it up. She has been using steroid eye drops for month, can feel drops going down the back of her throat. She took Fosamax for 5 years and concerned about its potential to have damage to the esophagus. Additionally she has a history of heartburn. Over the last couple of years she has been taking Tums as needed.  Generally takes Tums a couple of times a week.  Past Medical History:  Diagnosis Date  . CTS (carpal tunnel syndrome)    overnight splints  .  FH: colonic polyps   . Heart murmur   . Hyperlipidemia   . Osteoporosis   . Plantar fasciitis 2012   partial tear on MRI ; Dr Steffanie Rainwater & Dr Stefanie Libel  . 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 Retina disorder    scarring, left eye  . Squamous cell carcinoma 6 & 10 / 2013   cryotherapy & topical cream ; Dr Tonia Brooms     Past Surgical History:  Procedure Laterality Date  . CATARACT EXTRACTION Bilateral 2016  . [place]   x2  . COLONOSCOPY W/ POLYPECTOMY  2003 & 2013   negative 2008,  Dr Fuller Plan, due 2018  . G3  P2    . RADIAL KERATOTOMY  1998  . retina pexy  2015   for pin hole retinla bleed; Dr Gershon Crane  . TUBAL LIGATION    . VITRECTOMY Right 2016  . VITRECTOMY Left 04/17/2019, 08/07/2019  . wide resection     R shoulder pre malignant  lesion; Dr Tonia Brooms   Family History  Problem Relation Age of Onset  . Diabetes Mother   . Hypertension Mother   . Fibroids Mother        uterine  . Melanoma Father   . Colon polyps Father   . Hypertension Sister   . Skin cancer Sister        melanoma, basal cell   . Arthritis Maternal Aunt        Rheumatoid  . Hypertension Maternal Aunt   .  Hyperlipidemia Paternal Aunt   . Osteopenia Paternal Aunt   . Cancer Paternal Aunt        melanoma,ovarian  . Heart attack Maternal Grandmother 76  . Hypertension Maternal Grandmother   . Hyperlipidemia Maternal Grandmother   . Diabetes Maternal Grandmother   . Stroke Maternal Grandmother 76  . Thyroid disease Maternal Grandmother   . Heart attack Maternal Grandfather        MI in 28s  . Colon cancer Paternal Grandfather 41  . Esophageal cancer Neg Hx   . Pancreatic cancer Neg Hx   . Prostate cancer Neg Hx   . Rectal cancer Neg Hx   . Stomach cancer Neg Hx      Current Outpatient Medications  Medication Sig Dispense Refill  . brimonidine-timolol (COMBIGAN) 0.2-0.5 % ophthalmic solution Apply 1 drop to eye in the morning and at bedtime. Left eye    . Lifitegrast  (XIIDRA) 5 % SOLN Apply 1 drop to eye in the morning and at bedtime. Both eyes    . aspirin 81 MG tablet Take 81 mg by mouth daily.     Marland Kitchen b complex vitamins tablet Take 1 tablet by mouth daily.    . calcium carbonate (OS-CAL) 600 MG TABS Take 600 mg by mouth daily. With Vit D 800 units    . DUREZOL 0.05 % EMUL Place 1 drop into the left eye 2 (two) times daily.    . Estradiol (YUVAFEM) 10 MCG TABS vaginal tablet Place vaginally. 2-3 times a week    . Lecithin 400 MG CAPS Take 400 mg by mouth.     . Multiple Vitamin (MULTIVITAMIN) capsule Take 1 capsule by mouth daily.      . Omega-3 Fatty Acids (FISH OIL) 1000 MG CAPS Take by mouth daily.     Vladimir Faster Glycol-Propyl Glycol 0.4-0.3 % SOLN Apply 1 drop to eye daily as needed. Several times a day    . polyethylene glycol powder (GLYCOLAX/MIRALAX) powder Take 17 g by mouth 2 (two) times daily as needed. 3350 g 1  . pravastatin (PRAVACHOL) 40 MG tablet Take 1 tablet (40 mg total) by mouth daily. 90 tablet 3  . PROLENSA 0.07 % SOLN Apply 1 drop to eye daily. Left eye     No current facility-administered medications for this visit.   Allergies  Allergen Reactions  . Rosuvastatin     REACTION: did not like the way it made her feel.  dragging sensation    Review of Systems: No fevers, no sob, no urinary symptoms  Creatinine clearance cannot be calculated (Patient's most recent lab result is older than the maximum 21 days allowed.)   Physical Exam:    Wt Readings from Last 3 Encounters:  10/16/19 169 lb (76.7 kg)  03/27/19 164 lb (74.4 kg)  03/25/18 168 lb (76.2 kg)    BP 110/70   Pulse 82   Ht 5\' 2"  (1.575 m)   Wt 169 lb (76.7 kg)   LMP 08/03/2011   BMI 30.91 kg/m  Constitutional:  Pleasant female in no acute distress. Psychiatric: Normal mood and affect. Behavior is normal. EENT: Pupils normal.  Conjunctivae are normal. No scleral icterus. Neck supple.  Cardiovascular: Normal rate, regular rhythm. No edema Pulmonary/chest:  Effort normal and breath sounds normal. No wheezing, rales or rhonchi. Abdominal: Soft, nondistended, nontender. Bowel sounds active throughout. There are no masses palpable. No hepatomegaly. Neurological: Alert and oriented to person place and time. Skin: Skin is warm and dry. No rashes  noted.  Tye Savoy, NP  10/16/2019, 3:07 PM

## 2019-10-26 ENCOUNTER — Encounter: Payer: Self-pay | Admitting: Gastroenterology

## 2019-11-03 ENCOUNTER — Ambulatory Visit (AMBULATORY_SURGERY_CENTER): Payer: Commercial Managed Care - PPO | Admitting: Gastroenterology

## 2019-11-03 ENCOUNTER — Encounter: Payer: Self-pay | Admitting: Gastroenterology

## 2019-11-03 ENCOUNTER — Other Ambulatory Visit: Payer: Self-pay

## 2019-11-03 VITALS — BP 112/55 | HR 54 | Temp 97.3°F | Resp 13 | Ht 62.0 in | Wt 169.0 lb

## 2019-11-03 DIAGNOSIS — K319 Disease of stomach and duodenum, unspecified: Secondary | ICD-10-CM

## 2019-11-03 DIAGNOSIS — R1310 Dysphagia, unspecified: Secondary | ICD-10-CM

## 2019-11-03 DIAGNOSIS — K2100 Gastro-esophageal reflux disease with esophagitis, without bleeding: Secondary | ICD-10-CM | POA: Diagnosis not present

## 2019-11-03 DIAGNOSIS — K2970 Gastritis, unspecified, without bleeding: Secondary | ICD-10-CM

## 2019-11-03 DIAGNOSIS — K222 Esophageal obstruction: Secondary | ICD-10-CM | POA: Diagnosis not present

## 2019-11-03 DIAGNOSIS — K3189 Other diseases of stomach and duodenum: Secondary | ICD-10-CM

## 2019-11-03 MED ORDER — PANTOPRAZOLE SODIUM 40 MG PO TBEC: 40.0000 mg | DELAYED_RELEASE_TABLET | Freq: Every day | ORAL | 3 refills | Status: DC

## 2019-11-03 MED ORDER — SODIUM CHLORIDE 0.9 % IV SOLN: 500.0000 mL | Freq: Once | INTRAVENOUS | Status: DC

## 2019-11-03 NOTE — Patient Instructions (Signed)
Handouts given for Gastritis, GERD, Esophagitis and Stricture and Post-dilation diet.  Pick up your new prescription today for Protonix.  Follow post-dilation diet today.  YOU HAD AN ENDOSCOPIC PROCEDURE TODAY AT [facility]:   Refer to the procedure report that was given to you for any specific questions about what was found during the examination.  If the procedure report does not answer your questions, please call your gastroenterologist to clarify.  If you requested that your care partner not be given the details of your procedure findings, then the procedure report has been included in a sealed envelope for you to review at your convenience later.  YOU SHOULD EXPECT: Some feelings of bloating in the abdomen. Passage of more gas than usual.  Walking can help get rid of the air that was put into your GI tract during the procedure and reduce the bloating. If you had a lower endoscopy (such as a colonoscopy or flexible sigmoidoscopy) you may notice spotting of blood in your stool or on the toilet paper. If you underwent a bowel prep for your procedure, you may not have a normal bowel movement for a few days.  Please Note:  You might notice some irritation and congestion in your nose or some drainage.  This is from the oxygen used during your procedure.  There is no need for concern and it should clear up in a day or so.  SYMPTOMS TO REPORT IMMEDIATELY:   Following upper endoscopy (EGD)  Vomiting of blood or coffee ground material  New chest pain or pain under the shoulder blades  Painful or persistently difficult swallowing  New shortness of breath  Fever of 100F or higher  Black, tarry-looking stools  For urgent or emergent issues, a gastroenterologist can be reached at any hour by calling [phone]. Do not use MyChart messaging for urgent concerns.    DIET:  We do recommend a small meal at first, but then you may proceed to your regular diet.  Drink plenty of  fluids but you should avoid alcoholic beverages for 24 hours.  ACTIVITY:  You should plan to take it easy for the rest of today and you should NOT DRIVE or use heavy machinery until tomorrow (because of the sedation medicines used during the test).    FOLLOW UP: Our staff will call the number listed on your records 48-72 hours following your procedure to check on you and address any questions or concerns that you may have regarding the information given to you following your procedure. If we do not reach you, we will leave a message.  We will attempt to reach you two times.  During this call, we will ask if you have developed any symptoms of COVID 19. If you develop any symptoms (ie: fever, flu-like symptoms, shortness of breath, cough etc.) before then, please call [phone].  If you test positive for Covid 19 in the 2 weeks post procedure, please call and report this information to [place].    If any biopsies were taken you will be contacted by phone or by letter within the next 1-3 weeks.  Please call us at [phone] if you have not heard about the biopsies in 3 weeks.    SIGNATURES/CONFIDENTIALITY: You and/or your care partner have signed paperwork which will be entered into your electronic medical record.  These signatures attest to the fact that that the information above on your After Visit Summary has been reviewed and is understood.  Full responsibility of  the confidentiality of this discharge information lies with you and/or your care-partner. 

## 2019-11-03 NOTE — Progress Notes (Signed)
Called to room to assist during endoscopic procedure.  Patient ID and intended procedure confirmed with present staff. Received instructions for my participation in the procedure from the performing physician.  

## 2019-11-03 NOTE — Op Note (Addendum)
[facility] Patient Name: Yvonne Hall Procedure Date: 11/03/2019 3:32 PM MRN: [id number] Endoscopist: Ladene Artist , MD Age: 59 Referring MD:  Date of Birth: May 04, 1961 Gender: Female Account #: [id number] Procedure:                Upper GI endoscopy Indications:              Dysphagia, Gastroesophageal reflux disease Medicines:                Monitored Anesthesia Care Procedure:                Pre-Anesthesia Assessment:                           - Prior to the procedure, a History and Physical                            was performed, and patient medications and                            allergies were reviewed. The patient's tolerance of                            previous anesthesia was also reviewed. The risks                            and benefits of the procedure and the sedation                            options and risks were discussed with the patient.                            All questions were answered, and informed consent                            was obtained. Prior Anticoagulants: The patient has                            taken no previous anticoagulant or antiplatelet                            agents. ASA Grade Assessment: II - A patient with                            mild systemic disease. After reviewing the risks                            and benefits, the patient was deemed in                            satisfactory condition to undergo the procedure.                           After obtaining informed consent, the endoscope was  passed under direct vision. Throughout the                            procedure, the patient's blood pressure, pulse, and                            oxygen saturations were monitored continuously. The                            Endoscope was introduced through the mouth, and                            advanced to the second part of duodenum. The upper                            GI  endoscopy was accomplished without difficulty.                            The patient tolerated the procedure well. Scope In: Scope Out: Findings:                 LA Grade B (one or more mucosal breaks greater than                            5 mm, not extending between the tops of two mucosal                            folds) esophagitis with no bleeding was found at                            the gastroesophageal junction.                           One benign-appearing, intrinsic moderate stenosis                            was found at the gastroesophageal junction. This                            stenosis measured 1.2 cm (inner diameter) x less                            than one cm (in length). The stenosis was                            traversed. A guidewire was placed and the scope was                            withdrawn. Dilations were performed with Savary                            dilators with mild resistance at 13 mm, 14 mm and  15 mm. No heme noted.                           The exam of the esophagus was otherwise normal.                           Two localized small erosions with no bleeding and                            no stigmata of recent bleeding were found at the                            pylorus. Biopsies were taken with a cold forceps                            for histology.                           The exam of the stomach was otherwise normal.                           The duodenal bulb and second portion of the                            duodenum were normal. Complications:            No immediate complications. Estimated Blood Loss:     Estimated blood loss was minimal. Impression:               - LA Grade B reflux esophagitis with no bleeding.                           - Benign-appearing esophageal stenosis. Dilated.                           - Erosive gastropathy with no bleeding and no                            stigmata of  recent bleeding. Biopsied.                           - Normal duodenal bulb and second portion of the                            duodenum. Recommendation:           - Patient has a contact number available for                            emergencies. The signs and symptoms of potential                            delayed complications were discussed with the                            patient. Return to normal  activities tomorrow.                            Written discharge instructions were provided to the                            patient.                           - Clear liquids for 2 hours then soft diet today.                           - Resume previous diet tomorrow.                           - Follow antireflux measures long term.                           - Continue present medications.                           - Await pathology results.                           - Protonix (pantoprazole) 40 mg PO daily, 1 year of                            refills.                           - Return to GI office in 6 weeks with me or Tye Savoy, NP. Ladene Artist, MD 11/03/2019 4:08:37 PM This report has been signed electronically.

## 2019-11-03 NOTE — Progress Notes (Signed)
To PACU, VSS. Report to Rn. VO for additional meds.tb 

## 2019-11-07 ENCOUNTER — Telehealth: Payer: Self-pay | Admitting: *Deleted

## 2019-11-07 NOTE — Telephone Encounter (Signed)
  Follow up Call-  Call back number 11/03/2019 03/01/2017  Post procedure Call Back phone  # [phone] cell  Permission to leave phone message Yes Yes  Some recent data might be hidden     Patient questions:  Do you have a fever, pain , or abdominal swelling? No. Pain Score  0 *  Have you tolerated food without any problems? Yes.    Have you been able to return to your normal activities? Yes.    Do you have any questions about your discharge instructions: Diet   No. Medications  No. Follow up visit  No.  Do you have questions or concerns about your Care? No.  Actions: * If pain score is 4 or above: No action needed, pain <4.  1. Have you developed a fever since your procedure? no  2.   Have you had an respiratory symptoms (SOB or cough) since your procedure? no  3.   Have you tested positive for COVID 19 since your procedure no  4.   Have you had any family members/close contacts diagnosed with the COVID 19 since your procedure?  no   If yes to any of these questions please route to Joylene John, RN and Erenest Rasher, RN

## 2019-11-10 ENCOUNTER — Encounter: Payer: Self-pay | Admitting: Gastroenterology

## 2019-12-27 ENCOUNTER — Ambulatory Visit: Payer: Commercial Managed Care - PPO | Admitting: Gastroenterology

## 2020-01-18 ENCOUNTER — Ambulatory Visit (INDEPENDENT_AMBULATORY_CARE_PROVIDER_SITE_OTHER): Payer: Commercial Managed Care - PPO | Admitting: Gastroenterology

## 2020-01-18 ENCOUNTER — Encounter: Payer: Self-pay | Admitting: Gastroenterology

## 2020-01-18 VITALS — BP 110/70 | HR 64 | Ht 62.0 in | Wt 163.0 lb

## 2020-01-18 DIAGNOSIS — K2100 Gastro-esophageal reflux disease with esophagitis, without bleeding: Secondary | ICD-10-CM

## 2020-01-18 DIAGNOSIS — R1310 Dysphagia, unspecified: Secondary | ICD-10-CM

## 2020-01-18 DIAGNOSIS — R1319 Other dysphagia: Secondary | ICD-10-CM

## 2020-01-18 DIAGNOSIS — K2960 Other gastritis without bleeding: Secondary | ICD-10-CM

## 2020-01-18 NOTE — Patient Instructions (Signed)
Stay on pantoprazole.   Call our office if your trouble swallowing gets worse.   Thank you for choosing me and [facility].  Pricilla Riffle. Dagoberto Ligas., MD., Marval Regal

## 2020-01-18 NOTE — Progress Notes (Addendum)
    History of Present Illness: This is a 58 year old female returning for follow-up of GERD with esophagitis and an esophageal stricture which was dilated.  Recent EGD below.  Substantial improvement in his dysphagia although she occasionally notes very minimal solid food dysphagia.  Reflux symptoms well controlled on daily pantoprazole.  Chronic constipation is well controlled with MiraLAX.  EGD 10/2019  - LA Grade B reflux esophagitis with no bleeding. - Benign-appearing esophageal stenosis. Dilated. - Erosive gastropathy with no bleeding and no stigmata of recent bleeding. Biopsied. - Normal duodenal bulb and second portion of the duodenum.  Current Medications, Allergies, Past Medical History, Past Surgical History, Family History and Social History were reviewed in [employer] record.   Physical Exam: General: Well developed, well nourished, no acute distress Head: Normocephalic and atraumatic Eyes:  sclerae anicteric, EOMI Ears: Normal auditory acuity Mouth: Not examined, mask on during Covid-19 pandemic Lungs: Clear throughout to auscultation Heart: Regular rate and rhythm; no murmurs, rubs or bruits Abdomen: Soft, non tender and non distended. No masses, hepatosplenomegaly or hernias noted. Normal Bowel sounds Rectal: Not done Musculoskeletal: Symmetrical with no gross deformities  Pulses:  Normal pulses noted Extremities: No clubbing, cyanosis, edema or deformities noted Neurological: Alert oriented x 4, grossly nonfocal Psychological:  Alert and cooperative. Normal mood and affect   Assessment and Recommendations:  1.  GERD with LA Class B erosive esophagitis and an esophageal stricture.  Erosive gastropathy. Very good response to stricture dilation. Follow standard antireflux measures.  Continue pantoprazole 40 mg p.o. daily long-term.  We discussed long-term management and the potential need for repeat EGD with dilation if her dysphagia worsens. REV  in 1 year.   2.  Chronic constipation.  Continue MiraLAX once or twice daily.  3. Personal history of adenomatous colon polyps, sessile serrated polyps and family history of colon cancer.  A 5-year interval surveillance colonoscopy is recommended in October 2023.

## 2020-01-23 ENCOUNTER — Other Ambulatory Visit: Payer: Self-pay | Admitting: Obstetrics & Gynecology

## 2020-01-23 DIAGNOSIS — Z1231 Encounter for screening mammogram for malignant neoplasm of breast: Secondary | ICD-10-CM

## 2020-02-04 IMAGING — MG DIGITAL SCREENING BILATERAL MAMMOGRAM WITH TOMO AND CAD
8 series · 8 of 24 positions shown · non-contrast
Comparison: Previous exam(s).

CLINICAL DATA: Screening.

EXAM:
DIGITAL SCREENING BILATERAL MAMMOGRAM WITH TOMO AND CAD

[L CC synth-2D]
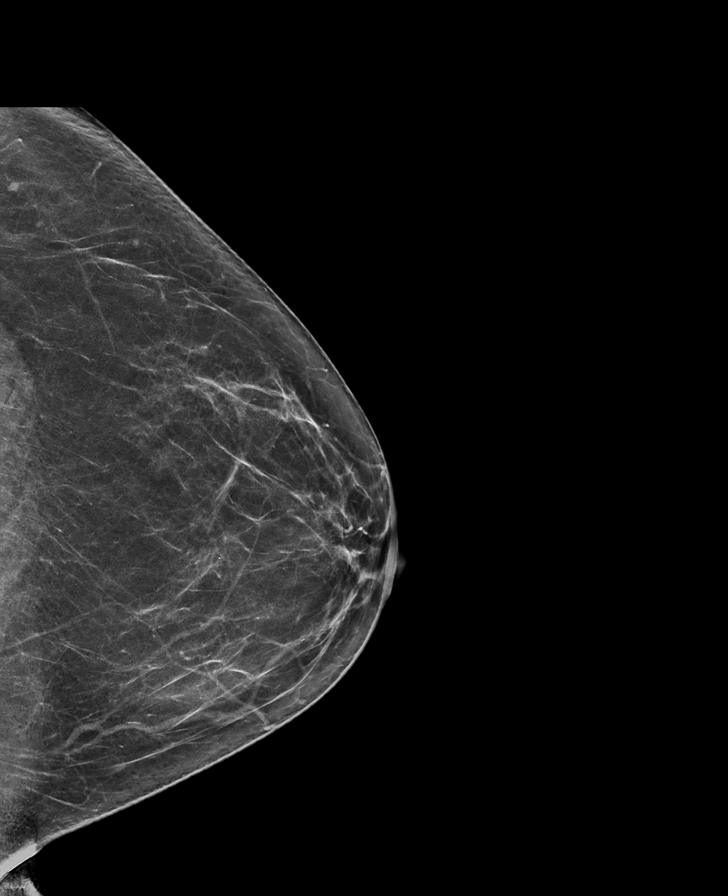

[L MLO synth-2D]
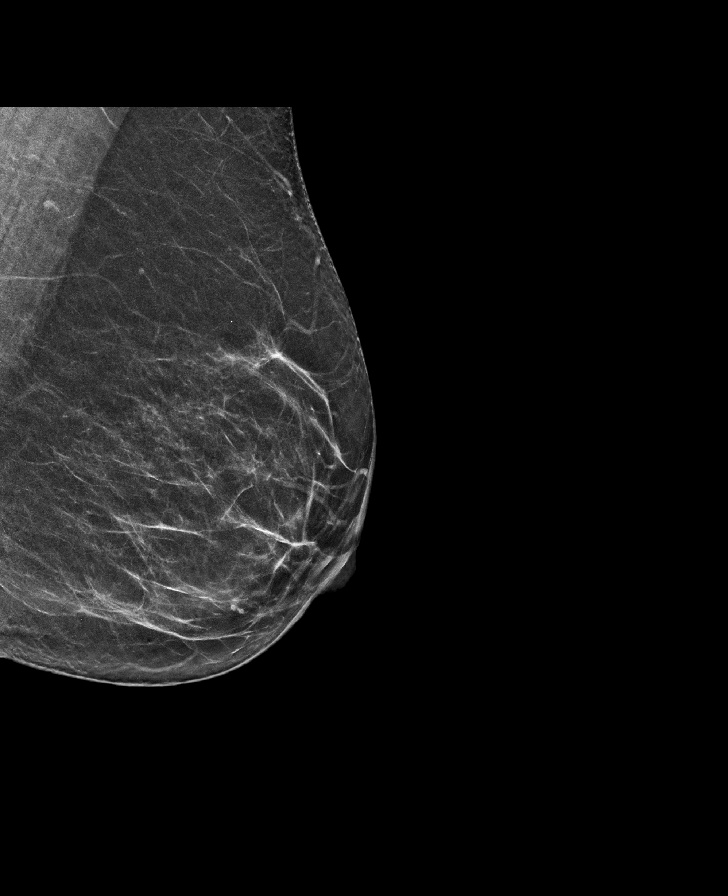

[R MLO synth-2D]
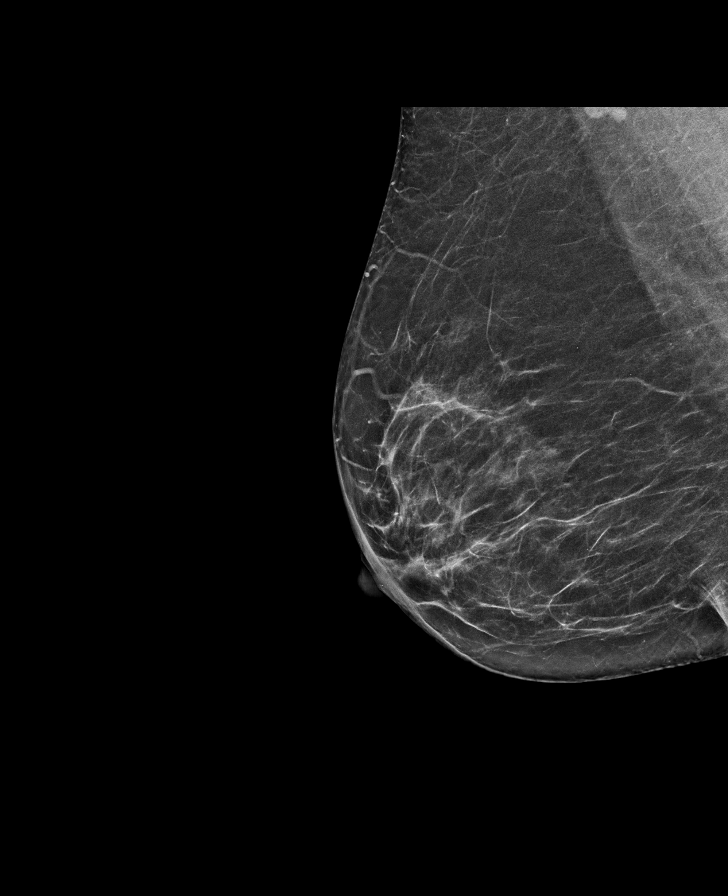

[R CC synth-2D]
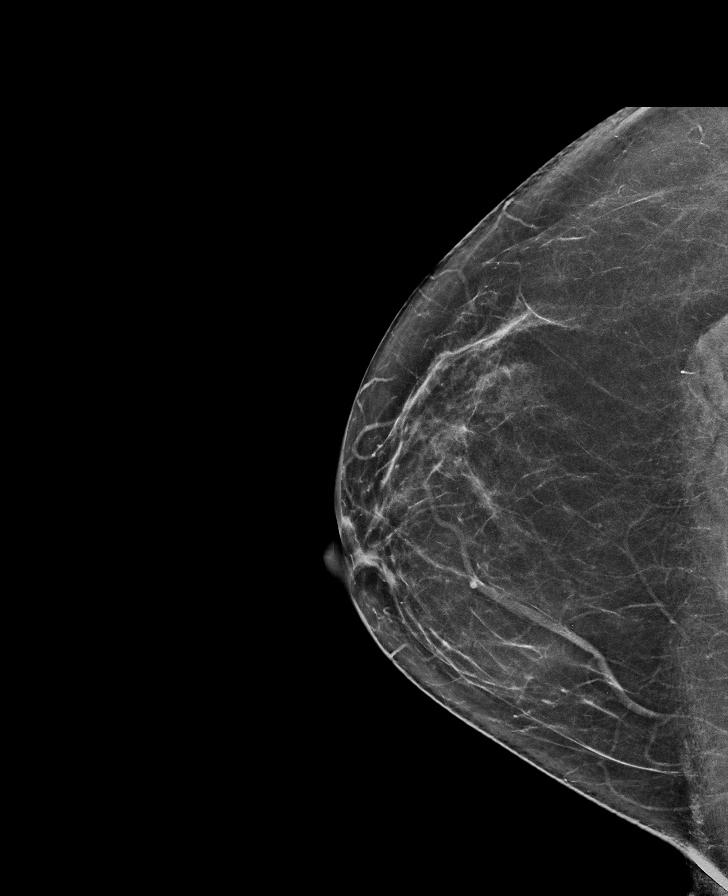

[L MLO tomo · tomo slice 34/67.0]
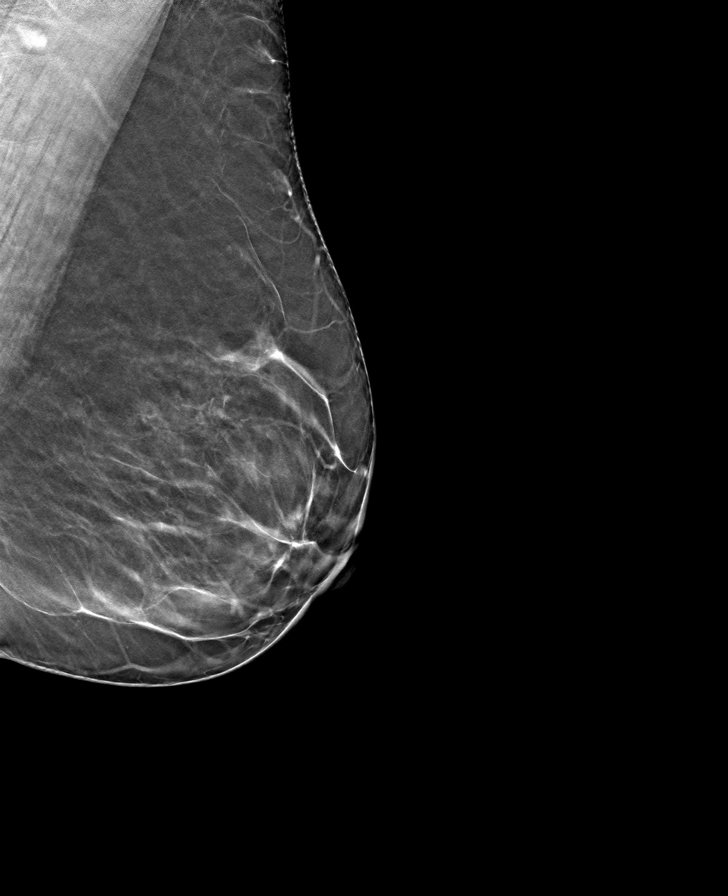

[L CC tomo · tomo slice 35/69.0]
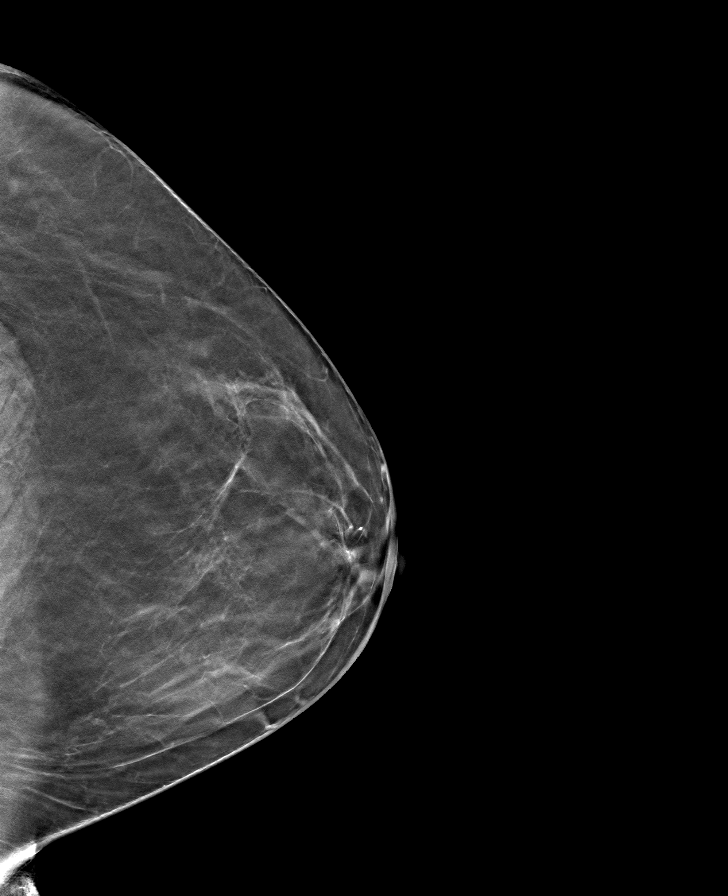

[R MLO tomo · tomo slice 35/70.0]
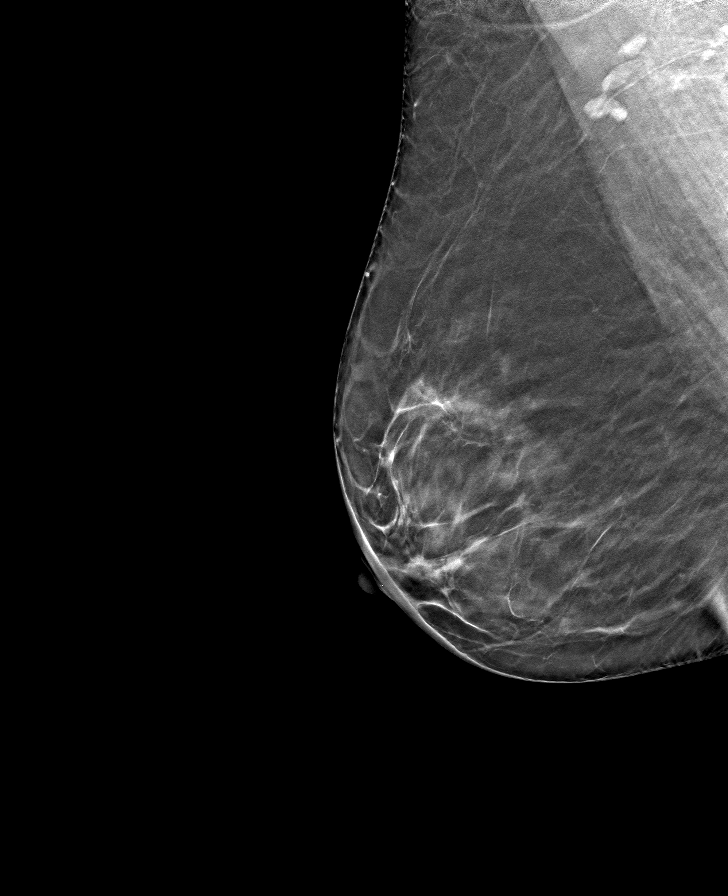

[R CC tomo · tomo slice 37/72.0]
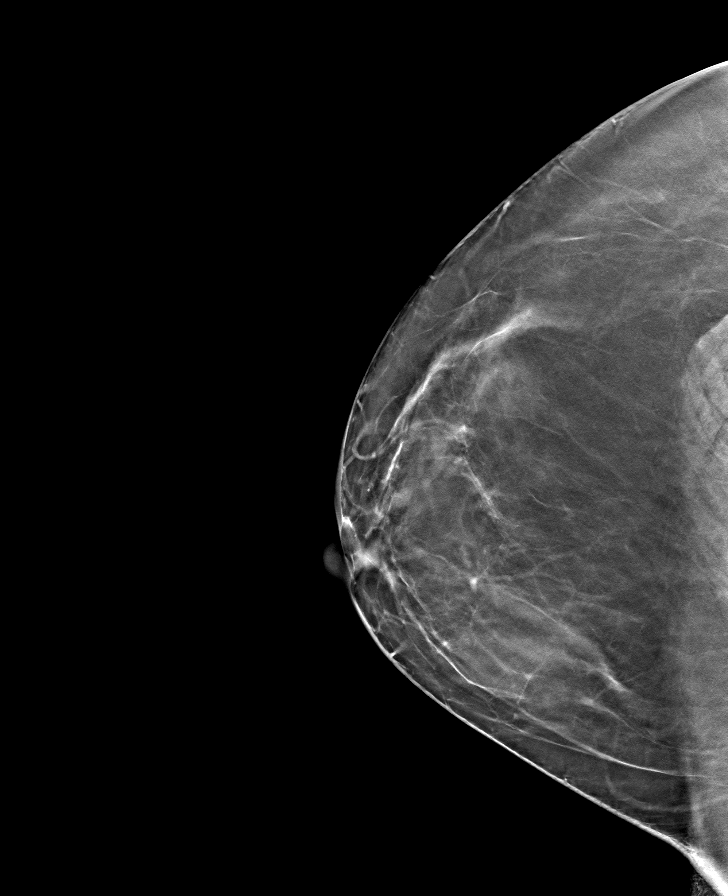

[8 of 24 positions shown; findings below may reference images not displayed]

ACR Breast Density Category b: There are scattered areas of
fibroglandular density.
FINDINGS: There are no findings suspicious for malignancy. Images were
processed with CAD.
IMPRESSION: No mammographic evidence of malignancy. A result letter of this
screening mammogram will be mailed directly to the patient.

RECOMMENDATION:
Screening mammogram in one year. (Code:[id number])

BI-RADS CATEGORY  1: Negative.

## 2020-02-19 DIAGNOSIS — H04123 Dry eye syndrome of bilateral lacrimal glands: Secondary | ICD-10-CM | POA: Insufficient documentation

## 2020-02-19 DIAGNOSIS — H168 Other keratitis: Secondary | ICD-10-CM | POA: Insufficient documentation

## 2020-03-11 ENCOUNTER — Other Ambulatory Visit: Payer: Self-pay

## 2020-03-11 ENCOUNTER — Ambulatory Visit
Admission: RE | Admit: 2020-03-11 | Discharge: 2020-03-11 | Disposition: A | Discharge status: Home / Self Care | Payer: Commercial Managed Care - PPO | Source: Ambulatory Visit | Attending: Obstetrics & Gynecology | Admitting: Obstetrics & Gynecology

## 2020-03-11 DIAGNOSIS — Z1231 Encounter for screening mammogram for malignant neoplasm of breast: Secondary | ICD-10-CM

## 2020-03-11 LAB — HM MAMMOGRAPHY

## 2020-03-21 ENCOUNTER — Encounter: Payer: Self-pay | Admitting: Internal Medicine

## 2020-03-21 NOTE — Progress Notes (Unsigned)
Outside notes received. Information abstracted. Notes sent to scan.  

## 2020-03-28 DIAGNOSIS — K219 Gastro-esophageal reflux disease without esophagitis: Secondary | ICD-10-CM | POA: Insufficient documentation

## 2020-03-28 NOTE — Patient Instructions (Addendum)
Blood work was reviewed.    All other Health Maintenance issues reviewed.   All recommended immunizations and age-appropriate screenings are up-to-date or discussed.  No immunization administered today.   Medications reviewed and updated.  Changes include :  none   Your prescription(s) have been submitted to your pharmacy. Please take as directed and contact our office if you believe you are having problem(s) with the medication(s).    Please followup in 1 year    Health Maintenance, Female Adopting a healthy lifestyle and getting preventive care are important in promoting health and wellness. Ask your health care provider about:  The right schedule for you to have regular tests and exams.  Things you can do on your own to prevent diseases and keep yourself healthy. What should I know about diet, weight, and exercise? Eat a healthy diet   Eat a diet that includes plenty of vegetables, fruits, low-fat dairy products, and lean protein.  Do not eat a lot of foods that are high in solid fats, added sugars, or sodium. Maintain a healthy weight Body mass index (BMI) is used to identify weight problems. It estimates body fat based on height and weight. Your health care provider can help determine your BMI and help you achieve or maintain a healthy weight. Get regular exercise Get regular exercise. This is one of the most important things you can do for your health. Most adults should:  Exercise for at least 150 minutes each week. The exercise should increase your heart rate and make you sweat (moderate-intensity exercise).  Do strengthening exercises at least twice a week. This is in addition to the moderate-intensity exercise.  Spend less time sitting. Even light physical activity can be beneficial. Watch cholesterol and blood lipids Have your blood tested for lipids and cholesterol at 60 years of age, then have this test every 5 years. Have your cholesterol levels checked more  often if:  Your lipid or cholesterol levels are high.  You are older than 60 years of age.  You are at high risk for heart disease. What should I know about cancer screening? Depending on your health history and family history, you may need to have cancer screening at various ages. This may include screening for:  Breast cancer.  Cervical cancer.  Colorectal cancer.  Skin cancer.  Lung cancer. What should I know about heart disease, diabetes, and high blood pressure? Blood pressure and heart disease  High blood pressure causes heart disease and increases the risk of stroke. This is more likely to develop in people who have high blood pressure readings, are of African descent, or are overweight.  Have your blood pressure checked: ? Every 3-5 years if you are 37-61 years of age. ? Every year if you are 57 years old or older. Diabetes Have regular diabetes screenings. This checks your fasting blood sugar level. Have the screening done:  Once every three years after age 83 if you are at a normal weight and have a low risk for diabetes.  More often and at a younger age if you are overweight or have a high risk for diabetes. What should I know about preventing infection? Hepatitis B If you have a higher risk for hepatitis B, you should be screened for this virus. Talk with your health care provider to find out if you are at risk for hepatitis B infection. Hepatitis C Testing is recommended for:  Everyone born from [birth year] through 1966.  Anyone with known risk factors for hepatitis  C. Sexually transmitted infections (STIs)  Get screened for STIs, including gonorrhea and chlamydia, if: ? You are sexually active and are younger than 60 years of age. ? You are older than 60 years of age and your health care provider tells you that you are at risk for this type of infection. ? Your sexual activity has changed since you were last screened, and you are at increased risk for chlamydia  or gonorrhea. Ask your health care provider if you are at risk.  Ask your health care provider about whether you are at high risk for HIV. Your health care provider may recommend a prescription medicine to help prevent HIV infection. If you choose to take medicine to prevent HIV, you should first get tested for HIV. You should then be tested every 3 months for as long as you are taking the medicine. Pregnancy  If you are about to stop having your period (premenopausal) and you may become pregnant, seek counseling before you get pregnant.  Take 400 to 800 micrograms (mcg) of folic acid every day if you become pregnant.  Ask for birth control (contraception) if you want to prevent pregnancy. Osteoporosis and menopause Osteoporosis is a disease in which the bones lose minerals and strength with aging. This can result in bone fractures. If you are 65 years old or older, or if you are at risk for osteoporosis and fractures, ask your health care provider if you should:  Be screened for bone loss.  Take a calcium or vitamin D supplement to lower your risk of fractures.  Be given hormone replacement therapy (HRT) to treat symptoms of menopause. Follow these instructions at home: Lifestyle  Do not use any products that contain nicotine or tobacco, such as cigarettes, e-cigarettes, and chewing tobacco. If you need help quitting, ask your health care provider.  Do not use street drugs.  Do not share needles.  Ask your health care provider for help if you need support or information about quitting drugs. Alcohol use  Do not drink alcohol if: ? Your health care provider tells you not to drink. ? You are pregnant, may be pregnant, or are planning to become pregnant.  If you drink alcohol: ? Limit how much you use to 0-1 drink a day. ? Limit intake if you are breastfeeding.  Be aware of how much alcohol is in your drink. In the [place] one drink equals one 12 oz bottle of beer (355 mL), one 5 oz  glass of wine (148 mL), or one 1 oz glass of hard liquor (44 mL). General instructions  Schedule regular health, dental, and eye exams.  Stay current with your vaccines.  Tell your health care provider if: ? You often feel depressed. ? You have ever been abused or do not feel safe at home. Summary  Adopting a healthy lifestyle and getting preventive care are important in promoting health and wellness.  Follow your health care provider's instructions about healthy diet, exercising, and getting tested or screened for diseases.  Follow your health care provider's instructions on monitoring your cholesterol and blood pressure. This information is not intended to replace advice given to you by your health care provider. Make sure you discuss any questions you have with your health care provider. Document Revised: 04/20/2018 Document Reviewed: 04/20/2018 Elsevier Patient Education  2020 [employer]  

## 2020-03-28 NOTE — Progress Notes (Signed)
Subjective:    Patient ID: Yvonne Hall, female    DOB: 1960/10/24, 59 y.o.   MRN: [id number]   This visit occurred during the SARS-CoV-2 public health emergency.  Safety protocols were in place, including screening questions prior to the visit, additional usage of staff PPE, and extensive cleaning of exam room while observing appropriate contact time as indicated for disinfecting solutions.    HPI She is here for a physical exam.   She has no concerns.  She has her labs from work.   Medications and allergies reviewed with patient and updated if appropriate.  Patient Active Problem List   Diagnosis Date Noted  . History of esophageal stricture 03/29/2020  . GERD (gastroesophageal reflux disease) 03/28/2020  . Actinic keratosis 03/22/2015  . Osteoporosis 04/04/2013  . 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 Plantar fasciitis 06/01/2011  . HIP PAIN, RIGHT 03/21/2010  . 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 COLLES' FRACTURE 12/02/2009  . HYPERLIPIDEMIA 03/22/2007  . Carpal tunnel syndrome 03/22/2007  . 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 History of colonic polyps 03/22/2007    Current Outpatient Medications on File Prior to Visit  Medication Sig Dispense Refill  . acetaZOLAMIDE (DIAMOX) 250 MG tablet Take 250 mg by mouth 2 (two) times daily.    Marland Kitchen aspirin 81 MG tablet Take 81 mg by mouth daily.     Marland Kitchen b complex vitamins tablet Take 1 tablet by mouth daily.    . brimonidine-timolol (COMBIGAN) 0.2-0.5 % ophthalmic solution Apply 1 drop to eye in the morning and at bedtime. Left eye    . calcium carbonate (OS-CAL) 600 MG TABS Take 600 mg by mouth daily. With Vit D 800 units    . latanoprost (XALATAN) 0.005 % ophthalmic solution latanoprost 0.005 % eye drops    . Lecithin 400 MG CAPS Take 400 mg by mouth.     Marland Kitchen Lifitegrast (XIIDRA) 5 % SOLN Apply 1 drop to eye in the morning and at bedtime. Both eyes      . loteprednol (LOTEMAX) 0.5 % ophthalmic suspension Place into the left eye in the morning and at bedtime.    . Multiple Vitamin (MULTIVITAMIN) capsule Take 1 capsule by mouth daily.      . Omega-3 Fatty Acids (FISH OIL) 1000 MG CAPS Take by mouth daily.     . pantoprazole (PROTONIX) 40 MG tablet Take 1 tablet (40 mg total) by mouth daily. 90 tablet 3  . polyethylene glycol powder (GLYCOLAX/MIRALAX) powder Take 17 g by mouth 2 (two) times daily as needed. 3350 g 1  . PROLENSA 0.07 % SOLN Apply 1 drop to eye 2 (two) times daily. Left eye     No current facility-administered medications on file prior to visit.    Past Medical History:  Diagnosis Date  . CTS (carpal tunnel syndrome)    overnight splints  . FH: colonic polyps   . Heart murmur   . Hyperlipidemia   . Osteoporosis   . Plantar fasciitis 2012   partial tear on MRI ; Dr Steffanie Rainwater & Dr Stefanie Libel  . 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 Retina disorder    scarring, left eye  . Squamous cell carcinoma 6 & 10 / 2013   cryotherapy & topical cream ; Dr Tonia Brooms    Past Surgical History:  Procedure Laterality Date  . CATARACT EXTRACTION Bilateral 2016  . CESAREAN SECTION  1985, 1997   x2  . COLONOSCOPY W/ POLYPECTOMY  2003 & 2013   negative 2008,  Dr Fuller Plan, due 2018  . G3  P2    . RADIAL KERATOTOMY  1998  . retina pexy  2015   for pin hole retinla bleed; Dr Gershon Crane  . TUBAL LIGATION    . VITRECTOMY Right 2016  . VITRECTOMY Left 04/17/2019, 08/07/2019  . wide resection     R shoulder pre malignant  lesion; Dr Tonia Brooms    Social History   Socioeconomic History  . Marital status: Married    Spouse name: Not on file  . Number of children: 2  . Years of education: Not on file  . Highest education level: Not on file  Occupational History  . Occupation: Optician, dispensing: [employer]  Tobacco Use  . Smoking status: Never Smoker  . Smokeless tobacco: Never Used  . Tobacco comment: but second  hand smoke exposure  Vaping Use  . Vaping Use: Never used  Substance and Sexual Activity  . Alcohol use: No  . Drug use: No  . Sexual activity: Not on file  Other Topics Concern  . Not on file  Social History Narrative   Gets reg exercise         Social Determinants of Health   Financial Resource Strain:   . Difficulty of Paying Living Expenses: Not on file  Food Insecurity:   . Worried About Charity fundraiser in the Last Year: Not on file  . Ran Out of Food in the Last Year: Not on file  Transportation Needs:   . Lack of Transportation (Medical): Not on file  . Lack of Transportation (Non-Medical): Not on file  Physical Activity:   . Days of Exercise per Week: Not on file  . Minutes of Exercise per Session: Not on file  Stress:   . Feeling of Stress : Not on file  Social Connections:   . Frequency of Communication with Friends and Family: Not on file  . Frequency of Social Gatherings with Friends and Family: Not on file  . Attends Religious Services: Not on file  . Active Member of Clubs or Organizations: Not on file  . Attends Archivist Meetings: Not on file  . Marital Status: Not on file    Family History  Problem Relation Age of Onset  . Diabetes Mother   . Hypertension Mother   . Fibroids Mother        uterine  . Melanoma Father   . Colon polyps Father   . Hypertension Sister   . Skin cancer Sister        melanoma, basal cell   . Arthritis Maternal Aunt        Rheumatoid  . Hypertension Maternal Aunt   . Hyperlipidemia Paternal Aunt   . Osteopenia Paternal Aunt   . Cancer Paternal Aunt        melanoma,ovarian  . Heart attack Maternal Grandmother 76  . Hypertension Maternal Grandmother   . Hyperlipidemia Maternal Grandmother   . Diabetes Maternal Grandmother   . Stroke Maternal Grandmother 76  . Thyroid disease Maternal Grandmother   . Heart attack Maternal Grandfather        MI in 61s  . Colon cancer Paternal Grandfather 21  .  Esophageal cancer Neg Hx   . Pancreatic cancer Neg Hx   . Prostate cancer Neg Hx   . Rectal cancer Neg Hx   .  Stomach cancer Neg Hx     Review of Systems  Constitutional: Negative for chills and fever.  HENT: Negative for trouble swallowing.   Eyes: Positive for visual disturbance.  Respiratory: Negative for cough, shortness of breath and wheezing.   Cardiovascular: Negative for chest pain and leg swelling.  Gastrointestinal: Negative for abdominal pain, blood in stool, constipation, diarrhea and nausea.       No gerd  Genitourinary: Negative for dysuria and hematuria.  Musculoskeletal: Negative for arthralgias and back pain.  Skin: Negative for color change and rash.  Neurological: Negative for light-headedness and headaches.  Psychiatric/Behavioral: Negative for dysphoric mood. The patient is not nervous/anxious.        Objective:   Vitals:   03/29/20 0916  BP: 106/78  Pulse: 70  Temp: 98.2 F (36.8 C)  SpO2: 99%   Filed Weights   03/29/20 0916  Weight: 161 lb (73 kg)   Body mass index is 29.45 kg/m.  BP Readings from Last 3 Encounters:  03/29/20 106/78  01/18/20 110/70  11/03/19 (!) 112/55    Wt Readings from Last 3 Encounters:  03/29/20 161 lb (73 kg)  01/18/20 163 lb (73.9 kg)  11/03/19 169 lb (76.7 kg)   Depression screen [facility] 2/9 03/29/2020 03/27/2019 03/25/2018 03/12/2017 03/21/2013  Decreased Interest 0 0 0 0 0  Down, Depressed, Hopeless 0 0 0 0 0  PHQ - 2 Score 0 0 0 0 0  Altered sleeping 0 - - - -  Tired, decreased energy 0 - - - -  Change in appetite 0 - - - -  Feeling bad or failure about yourself  0 - - - -  Trouble concentrating 0 - - - -  Moving slowly or fidgety/restless 0 - - - -  Suicidal thoughts 0 - - - -  PHQ-9 Score 0 - - - -  Difficult doing work/chores Not difficult at all - - - -       Physical Exam Constitutional: She appears well-developed and well-nourished. No distress.  HENT:  Head: Normocephalic and atraumatic.    Right Ear: External ear normal. Normal ear canal and TM Left Ear: External ear normal.  Normal ear canal and TM Mouth/Throat: Oropharynx is clear and moist.  Eyes: Conjunctivae and EOM are normal.  Neck: Neck supple. No tracheal deviation present. No thyromegaly present.  No carotid bruit  Cardiovascular: Normal rate, regular rhythm and normal heart sounds.   No murmur heard.  No edema. Pulmonary/Chest: Effort normal and breath sounds normal. No respiratory distress. She has no wheezes. She has no rales.  Breast: deferred   Abdominal: Soft. She exhibits no distension. There is no tenderness.  Lymphadenopathy: She has no cervical adenopathy.  Skin: Skin is warm and dry. She is not diaphoretic.  Psychiatric: She has a normal mood and affect. Her behavior is normal.        Assessment & Plan:   Physical exam: Screening blood work    Reviewed - done at work Immunizations  vaccines up to date Colonoscopy  Up to date  Mammogram  Up to date  Gyn  Up to date  Dexa  Due - ordered Eye exams  Up to date  Exercise  Very active, walking a lot, yard work, walks sometimes Weight  Improved - lost weight Substance abuse  none Sees derm  Screened for depression using the PHQ 9 scale.  No evidence of depression.     See Problem List for Assessment and Plan of chronic  medical problems.

## 2020-03-29 ENCOUNTER — Ambulatory Visit (INDEPENDENT_AMBULATORY_CARE_PROVIDER_SITE_OTHER): Payer: Commercial Managed Care - PPO | Admitting: Internal Medicine

## 2020-03-29 ENCOUNTER — Encounter: Payer: Self-pay | Admitting: Internal Medicine

## 2020-03-29 ENCOUNTER — Other Ambulatory Visit: Payer: Self-pay

## 2020-03-29 VITALS — BP 106/78 | HR 70 | Temp 98.2°F | Ht 62.0 in | Wt 161.0 lb

## 2020-03-29 DIAGNOSIS — Z0000 Encounter for general adult medical examination without abnormal findings: Secondary | ICD-10-CM

## 2020-03-29 DIAGNOSIS — M810 Age-related osteoporosis without current pathological fracture: Secondary | ICD-10-CM | POA: Diagnosis not present

## 2020-03-29 DIAGNOSIS — C4492 Squamous cell carcinoma of skin, unspecified: Secondary | ICD-10-CM

## 2020-03-29 DIAGNOSIS — E782 Mixed hyperlipidemia: Secondary | ICD-10-CM

## 2020-03-29 DIAGNOSIS — K219 Gastro-esophageal reflux disease without esophagitis: Secondary | ICD-10-CM | POA: Diagnosis not present

## 2020-03-29 DIAGNOSIS — Z8719 Personal history of other diseases of the digestive system: Secondary | ICD-10-CM

## 2020-03-29 MED ORDER — PRAVASTATIN SODIUM 40 MG PO TABS
40.0000 mg | ORAL_TABLET | Freq: Every day | ORAL | 3 refills | Status: DC
Start: 2020-03-29 — End: 2021-03-27

## 2020-03-29 NOTE — Assessment & Plan Note (Signed)
Sees dermatology annually Recently dx with [facility] -will be doing to surgical center for further treatment

## 2020-03-29 NOTE — Assessment & Plan Note (Addendum)
Chronic Took fosamax - caused GERD, esophageal stricture dexa - ordered for elam  Very active  Taking calcium and vitamin d

## 2020-03-29 NOTE — Assessment & Plan Note (Signed)
Chronic GERD controlled Continue protonix 40 mg daily  

## 2020-03-29 NOTE — Assessment & Plan Note (Signed)
No current symptoms

## 2020-03-29 NOTE — Assessment & Plan Note (Signed)
Chronic Lipids well controlled  Continue pravastatin 40 mg  Regular exercise and healthy diet encouraged

## 2020-04-12 DIAGNOSIS — H40042 Steroid responder, left eye: Secondary | ICD-10-CM | POA: Insufficient documentation

## 2020-04-12 DIAGNOSIS — Z9889 Other specified postprocedural states: Secondary | ICD-10-CM | POA: Insufficient documentation

## 2020-05-02 ENCOUNTER — Encounter: Payer: Self-pay | Admitting: Internal Medicine

## 2020-09-25 ENCOUNTER — Other Ambulatory Visit: Payer: Self-pay | Admitting: Gastroenterology

## 2020-09-25 DIAGNOSIS — K2100 Gastro-esophageal reflux disease with esophagitis, without bleeding: Secondary | ICD-10-CM

## 2020-11-06 ENCOUNTER — Other Ambulatory Visit: Payer: Self-pay | Admitting: Gastroenterology

## 2020-11-06 DIAGNOSIS — K2100 Gastro-esophageal reflux disease with esophagitis, without bleeding: Secondary | ICD-10-CM

## 2021-01-27 DIAGNOSIS — M3501 Sicca syndrome with keratoconjunctivitis: Secondary | ICD-10-CM | POA: Insufficient documentation

## 2021-01-27 DIAGNOSIS — H40021 Open angle with borderline findings, high risk, right eye: Secondary | ICD-10-CM | POA: Insufficient documentation

## 2021-02-03 ENCOUNTER — Other Ambulatory Visit: Payer: Self-pay | Admitting: Obstetrics & Gynecology

## 2021-02-03 DIAGNOSIS — Z1231 Encounter for screening mammogram for malignant neoplasm of breast: Secondary | ICD-10-CM

## 2021-03-17 ENCOUNTER — Ambulatory Visit
Admission: RE | Admit: 2021-03-17 | Discharge: 2021-03-17 | Disposition: A | Discharge status: Home / Self Care | Payer: Commercial Managed Care - PPO | Source: Ambulatory Visit | Attending: Obstetrics & Gynecology | Admitting: Obstetrics & Gynecology

## 2021-03-17 ENCOUNTER — Other Ambulatory Visit: Payer: Self-pay

## 2021-03-17 DIAGNOSIS — Z1231 Encounter for screening mammogram for malignant neoplasm of breast: Secondary | ICD-10-CM

## 2021-03-17 LAB — HM MAMMOGRAPHY

## 2021-03-18 ENCOUNTER — Encounter: Payer: Self-pay | Admitting: Internal Medicine

## 2021-03-27 ENCOUNTER — Other Ambulatory Visit: Payer: Self-pay | Admitting: Internal Medicine

## 2021-03-30 NOTE — Patient Instructions (Addendum)
Medications changes include :   none  Your prescription(s) have been submitted to your pharmacy. Please take as directed and contact our office if you believe you are having problem(s) with the medication(s).    Please followup in 1 year   Health Maintenance, Female Adopting a healthy lifestyle and getting preventive care are important in promoting health and wellness. Ask your health care provider about: The right schedule for you to have regular tests and exams. Things you can do on your own to prevent diseases and keep yourself healthy. What should I know about diet, weight, and exercise? Eat a healthy diet  Eat a diet that includes plenty of vegetables, fruits, low-fat dairy products, and lean protein. Do not eat a lot of foods that are high in solid fats, added sugars, or sodium. Maintain a healthy weight Body mass index (BMI) is used to identify weight problems. It estimates body fat based on height and weight. Your health care provider can help determine your BMI and help you achieve or maintain a healthy weight. Get regular exercise Get regular exercise. This is one of the most important things you can do for your health. Most adults should: Exercise for at least 150 minutes each week. The exercise should increase your heart rate and make you sweat (moderate-intensity exercise). Do strengthening exercises at least twice a week. This is in addition to the moderate-intensity exercise. Spend less time sitting. Even light physical activity can be beneficial. Watch cholesterol and blood lipids Have your blood tested for lipids and cholesterol at 61 years of age, then have this test every 5 years. Have your cholesterol levels checked more often if: Your lipid or cholesterol levels are high. You are older than 61 years of age. You are at high risk for heart disease. What should I know about cancer screening? Depending on your health history and family history, you may need to  have cancer screening at various ages. This may include screening for: Breast cancer. Cervical cancer. Colorectal cancer. Skin cancer. Lung cancer. What should I know about heart disease, diabetes, and high blood pressure? Blood pressure and heart disease High blood pressure causes heart disease and increases the risk of stroke. This is more likely to develop in people who have high blood pressure readings or are overweight. Have your blood pressure checked: Every 3-5 years if you are 53-44 years of age. Every year if you are 81 years old or older. Diabetes Have regular diabetes screenings. This checks your fasting blood sugar level. Have the screening done: Once every three years after age 21 if you are at a normal weight and have a low risk for diabetes. More often and at a younger age if you are overweight or have a high risk for diabetes. What should I know about preventing infection? Hepatitis B If you have a higher risk for hepatitis B, you should be screened for this virus. Talk with your health care provider to find out if you are at risk for hepatitis B infection. Hepatitis C Testing is recommended for: Everyone born from [birth year] through 1966. Anyone with known risk factors for hepatitis C. Sexually transmitted infections (STIs) Get screened for STIs, including gonorrhea and chlamydia, if: You are sexually active and are younger than 61 years of age. You are older than 61 years of age and your health care provider tells you that you are at risk for this type of infection. Your sexual activity has changed since you were last screened,  and you are at increased risk for chlamydia or gonorrhea. Ask your health care provider if you are at risk. Ask your health care provider about whether you are at high risk for HIV. Your health care provider may recommend a prescription medicine to help prevent HIV infection. If you choose to take medicine to prevent HIV, you should first get tested for  HIV. You should then be tested every 3 months for as long as you are taking the medicine. Pregnancy If you are about to stop having your period (premenopausal) and you may become pregnant, seek counseling before you get pregnant. Take 400 to 800 micrograms (mcg) of folic acid every day if you become pregnant. Ask for birth control (contraception) if you want to prevent pregnancy. Osteoporosis and menopause Osteoporosis is a disease in which the bones lose minerals and strength with aging. This can result in bone fractures. If you are 27 years old or older, or if you are at risk for osteoporosis and fractures, ask your health care provider if you should: Be screened for bone loss. Take a calcium or vitamin D supplement to lower your risk of fractures. Be given hormone replacement therapy (HRT) to treat symptoms of menopause. Follow these instructions at home: Alcohol use Do not drink alcohol if: Your health care provider tells you not to drink. You are pregnant, may be pregnant, or are planning to become pregnant. If you drink alcohol: Limit how much you have to: 0-1 drink a day. Know how much alcohol is in your drink. In the [place] one drink equals one 12 oz bottle of beer (355 mL), one 5 oz glass of wine (148 mL), or one 1 oz glass of hard liquor (44 mL). Lifestyle Do not use any products that contain nicotine or tobacco. These products include cigarettes, chewing tobacco, and vaping devices, such as e-cigarettes. If you need help quitting, ask your health care provider. Do not use street drugs. Do not share needles. Ask your health care provider for help if you need support or information about quitting drugs. General instructions Schedule regular health, dental, and eye exams. Stay current with your vaccines. Tell your health care provider if: You often feel depressed. You have ever been abused or do not feel safe at home. Summary Adopting a healthy lifestyle and getting preventive  care are important in promoting health and wellness. Follow your health care provider's instructions about healthy diet, exercising, and getting tested or screened for diseases. Follow your health care provider's instructions on monitoring your cholesterol and blood pressure. This information is not intended to replace advice given to you by your health care provider. Make sure you discuss any questions you have with your health care provider. Document Revised: 09/16/2020 Document Reviewed: 09/16/2020 Elsevier Patient Education  [place].

## 2021-03-30 NOTE — Progress Notes (Signed)
Subjective:    Patient ID: Yvonne Hall, female    DOB: 07/24/1960, 60 y.o.   MRN: [id number]   This visit occurred during the SARS-CoV-2 public health emergency.  Safety protocols were in place, including screening questions prior to the visit, additional usage of staff PPE, and extensive cleaning of exam room while observing appropriate contact time as indicated for disinfecting solutions.    HPI She is here for a physical exam.    She had blood work done at work.  She has no complaints.     Medications and allergies reviewed with patient and updated if appropriate.  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      b complex vitamins tablet Take 1 tablet by mouth daily.     calcium carbonate (OS-CAL) 600 MG TABS Take 600 mg by mouth daily. With Vit D 800 units     Lecithin 400 MG CAPS Take 400 mg by mouth.      Lifitegrast (XIIDRA) 5 % SOLN Apply 1 drop to eye in the morning and at bedtime. Both eyes     Omega-3 Fatty Acids (FISH OIL) 1000 MG CAPS Take by mouth daily.      pantoprazole (PROTONIX) 40 MG tablet Take 1 tablet by mouth once daily 90 tablet 0   polyethylene glycol powder (GLYCOLAX/MIRALAX) powder Take 17 g by mouth 2 (two) times daily as needed. 3350 g 1   pravastatin (PRAVACHOL) 40 MG  tablet Take 1 tablet by mouth once daily 34 tablet 0   timolol (TIMOPTIC) 0.5 % ophthalmic solution Place 1 drop into the left eye daily.     No current facility-administered medications on file prior to visit.    Past Medical History:  Diagnosis Date   CTS (carpal tunnel syndrome)    overnight splints   FH: colonic polyps    Heart murmur    Hyperlipidemia    Osteoporosis    Plantar fasciitis 2012   partial tear on MRI ; Dr Steffanie Rainwater & Dr Stefanie Libel   PPD positive, treated 1994   6 months INH   Retina disorder    scarring, left eye   Squamous cell carcinoma 6 & 10 / 2013   cryotherapy & topical cream ; Dr Tonia Brooms    Past Surgical History:  Procedure Laterality Date   CATARACT EXTRACTION Bilateral 2016   [place], 1997   x2   COLONOSCOPY W/ POLYPECTOMY  2003 & 2013   negative 2008,  Dr Fuller Plan, due 2018   G3  P2     [place]   retina pexy  2015   for pin hole retinla bleed; Dr Gershon Crane   TUBAL LIGATION     VITRECTOMY Right 2016   VITRECTOMY Left 04/17/2019, 08/07/2019   wide resection  R shoulder pre malignant  lesion; Dr Tonia Brooms    Social History   Socioeconomic History   Marital status: Married    Spouse name: Not on file   Number of children: 2   Years of education: Not on file   Highest education level: Not on file  Occupational History   Occupation: Nurse    Employer: [employer]  Tobacco Use   Smoking status: Never   Smokeless tobacco: Never   Tobacco comments:    but second hand smoke exposure  Vaping Use   Vaping Use: Never used  Substance and Sexual Activity   Alcohol use: No   Drug use: No   Sexual activity: Not on file  Other Topics Concern   Not on file  Social History Narrative   Gets reg exercise         Social Determinants of Health   Financial Resource Strain: Not on file  Food Insecurity: Not on file  Transportation Needs: Not on file  Physical Activity: Not on file  Stress: Not  on file  Social Connections: Not on file    Family History  Problem Relation Age of Onset   Diabetes Mother    Hypertension Mother    Fibroids Mother        uterine   Melanoma Father    Colon polyps Father    Hypertension Sister    Skin cancer Sister        melanoma, basal cell    Arthritis Maternal Aunt        Rheumatoid   Hypertension Maternal Aunt    Hyperlipidemia Paternal Aunt    Osteopenia Paternal Aunt    Cancer Paternal Aunt        melanoma,ovarian   Heart attack Maternal Grandmother 76   Hypertension Maternal Grandmother    Hyperlipidemia Maternal Grandmother    Diabetes Maternal Grandmother    Stroke Maternal Grandmother 76   Thyroid disease Maternal Grandmother    Heart attack Maternal Grandfather        MI in 56s   Colon cancer Paternal Grandfather 70   Esophageal cancer Neg Hx    Pancreatic cancer Neg Hx    Prostate cancer Neg Hx    Rectal cancer Neg Hx    Stomach cancer Neg Hx     Review of Systems  Constitutional:  Negative for chills and fever.  HENT:  Negative for trouble swallowing.   Eyes:  Negative for visual disturbance.  Respiratory:  Negative for cough, shortness of breath and wheezing.   Cardiovascular:  Positive for leg swelling (mild with sitting all day). Negative for chest pain and palpitations.  Gastrointestinal:  Negative for abdominal pain, blood in stool, constipation, diarrhea and nausea.       Gerd controlled  Genitourinary:  Negative for dysuria and hematuria.  Musculoskeletal:  Positive for arthralgias (mild) and back pain (depending on activity - mild).  Skin:  Negative for color change and rash.  Neurological:  Positive for headaches (occ - sinus). Negative for dizziness and light-headedness.  Psychiatric/Behavioral:  Negative for dysphoric mood. The patient is not nervous/anxious.       Objective:   Vitals:   03/31/21 0752  BP: 118/80  Pulse: 60  Temp: (!) 97.5 F (36.4 C)  SpO2: 99%   Filed Weights   03/31/21 0752   Weight: 161 lb (73 kg)   Body mass index is 29.45 kg/m.  BP Readings from Last 3 Encounters:  03/31/21 118/80  03/29/20  106/78  01/18/20 110/70    Wt Readings from Last 3 Encounters:  03/31/21 161 lb (73 kg)  03/29/20 161 lb (73 kg)  01/18/20 163 lb (73.9 kg)    Depression screen [facility] 2/9 03/31/2021 03/29/2020 03/27/2019 03/25/2018 03/12/2017  Decreased Interest 0 0 0 0 0  Down, Depressed, Hopeless 0 0 0 0 0  PHQ - 2 Score 0 0 0 0 0  Altered sleeping 0 0 - - -  Tired, decreased energy 0 0 - - -  Change in appetite 0 0 - - -  Feeling bad or failure about yourself  0 0 - - -  Trouble concentrating 0 0 - - -  Moving slowly or fidgety/restless 0 0 - - -  Suicidal thoughts 0 0 - - -  PHQ-9 Score 0 0 - - -  Difficult doing work/chores - Not difficult at all - - -     GAD 7 : Generalized Anxiety Score 03/31/2021  Nervous, Anxious, on Edge 0  Control/stop worrying 0  Worry too much - different things 0  Trouble relaxing 0  Restless 0  Easily annoyed or irritable 0  Afraid - awful might happen 0  Total GAD 7 Score 0       Physical Exam Constitutional: She appears well-developed and well-nourished. No distress.  HENT:  Head: Normocephalic and atraumatic.  Right Ear: External ear normal. Normal ear canal and TM Left Ear: External ear normal.  Normal ear canal and TM Mouth/Throat: Oropharynx is clear and moist.  Eyes: Conjunctivae and EOM are normal.  Neck: Neck supple. No tracheal deviation present. No thyromegaly present.  No carotid bruit  Cardiovascular: Normal rate, regular rhythm and normal heart sounds.   No murmur heard.  No edema. Pulmonary/Chest: Effort normal and breath sounds normal. No respiratory distress. She has no wheezes. She has no rales.  Breast: deferred   Abdominal: Soft. She exhibits no distension. There is no tenderness.  Lymphadenopathy: She has no cervical adenopathy.  Skin: Skin is warm and dry. She is not diaphoretic.  Psychiatric: She  has a normal mood and affect. Her behavior is normal.     Lab Results  Component Value Date   WBC 4.4 03/21/2014   HGB 14.5 03/06/2015   HCT 45 03/06/2015   PLT 160 03/06/2015   GLUCOSE 94 03/21/2014   CHOL 208 (A) 03/06/2015   TRIG 83 03/06/2015   HDL 67 03/06/2015   LDLCALC 115 03/06/2015   ALT 22 03/06/2015   AST 23 03/06/2015   NA 143 03/06/2015   K 4.2 03/06/2015   CL 105 03/21/2014   CREATININE 0.7 03/06/2015   BUN 12 03/06/2015   CO2 23 03/21/2014   TSH 1.37 03/21/2014         Assessment & Plan:   Physical exam: Screening blood work  reviewed Exercise  walking , very active Weight  overweight Substance abuse  none Sees derm  Screened for depression using the PHQ 9 scale.  No evidence of depression.   Screened for anxiety using GAD7 Scale.  No evidence of anxiety.   Reviewed recommended immunizations.   Health Maintenance  Topic Date Due   DEXA SCAN  03/25/2020   COVID-19 Vaccine (4 - Booster for Moderna series) 04/16/2021 (Originally 05/11/2020)   TETANUS/TDAP  12/30/2021   COLONOSCOPY (Pts 45-78yrs Insurance coverage will need to be confirmed)  03/01/2022   PAP SMEAR-Modifier  02/22/2023   MAMMOGRAM  03/18/2023   INFLUENZA VACCINE  Completed   Hepatitis  C Screening  Completed   HIV Screening  Completed   HPV VACCINES  Aged Out   Pneumococcal Vaccine 76-28 Years old  Discontinued   Zoster Vaccines- Shingrix  Discontinued          See Problem List for Assessment and Plan of chronic medical problems.

## 2021-03-31 ENCOUNTER — Encounter: Payer: Self-pay | Admitting: Internal Medicine

## 2021-03-31 ENCOUNTER — Ambulatory Visit (INDEPENDENT_AMBULATORY_CARE_PROVIDER_SITE_OTHER): Payer: Commercial Managed Care - PPO | Admitting: Internal Medicine

## 2021-03-31 ENCOUNTER — Other Ambulatory Visit: Payer: Self-pay

## 2021-03-31 VITALS — BP 118/80 | HR 60 | Temp 97.5°F | Ht 62.0 in | Wt 161.0 lb

## 2021-03-31 DIAGNOSIS — K2100 Gastro-esophageal reflux disease with esophagitis, without bleeding: Secondary | ICD-10-CM

## 2021-03-31 DIAGNOSIS — K219 Gastro-esophageal reflux disease without esophagitis: Secondary | ICD-10-CM

## 2021-03-31 DIAGNOSIS — Z0000 Encounter for general adult medical examination without abnormal findings: Secondary | ICD-10-CM

## 2021-03-31 DIAGNOSIS — E782 Mixed hyperlipidemia: Secondary | ICD-10-CM | POA: Diagnosis not present

## 2021-03-31 DIAGNOSIS — M810 Age-related osteoporosis without current pathological fracture: Secondary | ICD-10-CM

## 2021-03-31 DIAGNOSIS — Z1331 Encounter for screening for depression: Secondary | ICD-10-CM

## 2021-03-31 MED ORDER — PRAVASTATIN SODIUM 40 MG PO TABS: 40.0000 mg | ORAL_TABLET | Freq: Every day | ORAL | 3 refills | Status: DC

## 2021-03-31 MED ORDER — PANTOPRAZOLE SODIUM 40 MG PO TBEC: 40.0000 mg | DELAYED_RELEASE_TABLET | Freq: Every day | ORAL | 3 refills | Status: DC

## 2021-03-31 NOTE — Assessment & Plan Note (Signed)
Chronic Lipids well controlled-she had her blood work done recently at work Continue pravastatin 40 mg daily Regular exercise, healthy diet encouraged

## 2021-03-31 NOTE — Assessment & Plan Note (Addendum)
Chronic Follows with GI GERD controlled Continue pantoprazole 40 mg daily

## 2021-03-31 NOTE — Assessment & Plan Note (Signed)
Chronic Continue calcium and vitamin D daily-vitamin D checked recently at work and was 48 DEXA due-ordered Was on Fosamax, but this was not tolerated secondary to GERD-esophageal stricture Discussed Reclast, Prolia

## 2021-05-26 ENCOUNTER — Other Ambulatory Visit: Payer: Self-pay

## 2021-05-26 ENCOUNTER — Ambulatory Visit (INDEPENDENT_AMBULATORY_CARE_PROVIDER_SITE_OTHER)
Admission: RE | Admit: 2021-05-26 | Discharge: 2021-05-26 | Disposition: A | Discharge status: Home / Self Care | Payer: Commercial Managed Care - PPO | Source: Ambulatory Visit | Attending: Internal Medicine | Admitting: Internal Medicine

## 2021-05-26 DIAGNOSIS — M810 Age-related osteoporosis without current pathological fracture: Secondary | ICD-10-CM | POA: Diagnosis not present

## 2021-10-31 ENCOUNTER — Encounter: Payer: Self-pay | Admitting: Internal Medicine

## 2022-01-19 ENCOUNTER — Other Ambulatory Visit: Payer: Self-pay | Admitting: Obstetrics & Gynecology

## 2022-01-19 DIAGNOSIS — Z1231 Encounter for screening mammogram for malignant neoplasm of breast: Secondary | ICD-10-CM

## 2022-03-19 ENCOUNTER — Encounter: Payer: Self-pay | Admitting: Internal Medicine

## 2022-03-23 ENCOUNTER — Ambulatory Visit
Admission: RE | Admit: 2022-03-23 | Discharge: 2022-03-23 | Disposition: A | Discharge status: Home / Self Care | Payer: Commercial Managed Care - PPO | Source: Ambulatory Visit | Attending: Obstetrics & Gynecology | Admitting: Obstetrics & Gynecology

## 2022-03-23 DIAGNOSIS — Z1231 Encounter for screening mammogram for malignant neoplasm of breast: Secondary | ICD-10-CM

## 2022-03-23 LAB — HM MAMMOGRAPHY

## 2022-03-26 ENCOUNTER — Encounter: Payer: Self-pay | Admitting: Internal Medicine

## 2022-03-26 NOTE — Progress Notes (Signed)
Outside notes received. Information abstracted. Notes sent to scan.  

## 2022-03-29 ENCOUNTER — Encounter: Payer: Self-pay | Admitting: Internal Medicine

## 2022-03-29 NOTE — Patient Instructions (Addendum)
Medications changes include :   None    Return in about 1 year (around 04/02/2023) for Physical Exam.   Health Maintenance, Female Adopting a healthy lifestyle and getting preventive care are important in promoting health and wellness. Ask your health care provider about: The right schedule for you to have regular tests and exams. Things you can do on your own to prevent diseases and keep yourself healthy. What should I know about diet, weight, and exercise? Eat a healthy diet  Eat a diet that includes plenty of vegetables, fruits, low-fat dairy products, and lean protein. Do not eat a lot of foods that are high in solid fats, added sugars, or sodium. Maintain a healthy weight Body mass index (BMI) is used to identify weight problems. It estimates body fat based on height and weight. Your health care provider can help determine your BMI and help you achieve or maintain a healthy weight. Get regular exercise Get regular exercise. This is one of the most important things you can do for your health. Most adults should: Exercise for at least 150 minutes each week. The exercise should increase your heart rate and make you sweat (moderate-intensity exercise). Do strengthening exercises at least twice a week. This is in addition to the moderate-intensity exercise. Spend less time sitting. Even light physical activity can be beneficial. Watch cholesterol and blood lipids Have your blood tested for lipids and cholesterol at 62 years of age, then have this test every 5 years. Have your cholesterol levels checked more often if: Your lipid or cholesterol levels are high. You are older than 62 years of age. You are at high risk for heart disease. What should I know about cancer screening? Depending on your health history and family history, you may need to have cancer screening at various ages. This may include screening for: Breast cancer. Cervical cancer. Colorectal cancer. Skin  cancer. Lung cancer. What should I know about heart disease, diabetes, and high blood pressure? Blood pressure and heart disease High blood pressure causes heart disease and increases the risk of stroke. This is more likely to develop in people who have high blood pressure readings or are overweight. Have your blood pressure checked: Every 3-5 years if you are 30-76 years of age. Every year if you are 51 years old or older. Diabetes Have regular diabetes screenings. This checks your fasting blood sugar level. Have the screening done: Once every three years after age 4 if you are at a normal weight and have a low risk for diabetes. More often and at a younger age if you are overweight or have a high risk for diabetes. What should I know about preventing infection? Hepatitis B If you have a higher risk for hepatitis B, you should be screened for this virus. Talk with your health care provider to find out if you are at risk for hepatitis B infection. Hepatitis C Testing is recommended for: Everyone born from [birth year] through 1966. Anyone with known risk factors for hepatitis C. Sexually transmitted infections (STIs) Get screened for STIs, including gonorrhea and chlamydia, if: You are sexually active and are younger than 62 years of age. You are older than 62 years of age and your health care provider tells you that you are at risk for this type of infection. Your sexual activity has changed since you were last screened, and you are at increased risk for chlamydia or gonorrhea. Ask your health care provider if you are at risk. Ask your  health care provider about whether you are at high risk for HIV. Your health care provider may recommend a prescription medicine to help prevent HIV infection. If you choose to take medicine to prevent HIV, you should first get tested for HIV. You should then be tested every 3 months for as long as you are taking the medicine. Pregnancy If you are about to stop  having your period (premenopausal) and you may become pregnant, seek counseling before you get pregnant. Take 400 to 800 micrograms (mcg) of folic acid every day if you become pregnant. Ask for birth control (contraception) if you want to prevent pregnancy. Osteoporosis and menopause Osteoporosis is a disease in which the bones lose minerals and strength with aging. This can result in bone fractures. If you are 41 years old or older, or if you are at risk for osteoporosis and fractures, ask your health care provider if you should: Be screened for bone loss. Take a calcium or vitamin D supplement to lower your risk of fractures. Be given hormone replacement therapy (HRT) to treat symptoms of menopause. Follow these instructions at home: Alcohol use Do not drink alcohol if: Your health care provider tells you not to drink. You are pregnant, may be pregnant, or are planning to become pregnant. If you drink alcohol: Limit how much you have to: 0-1 drink a day. Know how much alcohol is in your drink. In the [place] one drink equals one 12 oz bottle of beer (355 mL), one 5 oz glass of wine (148 mL), or one 1 oz glass of hard liquor (44 mL). Lifestyle Do not use any products that contain nicotine or tobacco. These products include cigarettes, chewing tobacco, and vaping devices, such as e-cigarettes. If you need help quitting, ask your health care provider. Do not use street drugs. Do not share needles. Ask your health care provider for help if you need support or information about quitting drugs. General instructions Schedule regular health, dental, and eye exams. Stay current with your vaccines. Tell your health care provider if: You often feel depressed. You have ever been abused or do not feel safe at home. Summary Adopting a healthy lifestyle and getting preventive care are important in promoting health and wellness. Follow your health care provider's instructions about healthy diet,  exercising, and getting tested or screened for diseases. Follow your health care provider's instructions on monitoring your cholesterol and blood pressure. This information is not intended to replace advice given to you by your health care provider. Make sure you discuss any questions you have with your health care provider. Document Revised: 09/16/2020 Document Reviewed: 09/16/2020 Elsevier Patient Education  2023 [employer].

## 2022-03-29 NOTE — Progress Notes (Unsigned)
Subjective:    Patient ID: Yvonne Hall, female    DOB: 1960-08-02, 61 y.o.   MRN: [id number]      HPI Yvonne Hall is here for a Physical exam.   Had blood work done at work - reviewed   Medications and allergies reviewed with patient and updated if appropriate.  Current Outpatient Medications on File Prior to Visit  Medication Sig Dispense Refill   aspirin 81 MG tablet Take 81 mg by mouth daily.      b complex vitamins tablet Take 1 tablet by mouth daily.     calcium carbonate (OS-CAL) 600 MG TABS Take 600 mg by mouth daily. With Vit D 800 units     Lecithin 400 MG CAPS Take 400 mg by mouth.      Lifitegrast (XIIDRA) 5 % SOLN Apply 1 drop to eye in the morning and at bedtime. Both eyes     Omega-3 Fatty Acids (FISH OIL) 1000 MG CAPS Take by mouth daily.      pantoprazole (PROTONIX) 40 MG tablet Take 1 tablet (40 mg total) by mouth daily. 90 tablet 3   polyethylene glycol powder (GLYCOLAX/MIRALAX) powder Take 17 g by mouth 2 (two) times daily as needed. 3350 g 1   pravastatin (PRAVACHOL) 40 MG tablet Take 1 tablet (40 mg total) by mouth daily. 90 tablet 3   timolol (TIMOPTIC) 0.5 % ophthalmic solution Place 1 drop into the left eye daily.     No current facility-administered medications on file prior to visit.    Review of Systems     Objective:  There were no vitals filed for this visit. There were no vitals filed for this visit. There is no height or weight on file to calculate BMI.  BP Readings from Last 3 Encounters:  03/31/21 118/80  03/29/20 106/78  01/18/20 110/70    Wt Readings from Last 3 Encounters:  03/31/21 161 lb (73 kg)  03/29/20 161 lb (73 kg)  01/18/20 163 lb (73.9 kg)       Physical Exam Constitutional: She appears well-developed and well-nourished. No distress.  HENT:  Head: Normocephalic and atraumatic.  Right Ear: External ear normal. Normal ear canal and TM Left Ear: External ear normal.  Normal ear canal and TM Mouth/Throat:  Oropharynx is clear and moist.  Eyes: Conjunctivae normal.  Neck: Neck supple. No tracheal deviation present. No thyromegaly present.  No carotid bruit  Cardiovascular: Normal rate, regular rhythm and normal heart sounds.   No murmur heard.  No edema. Pulmonary/Chest: Effort normal and breath sounds normal. No respiratory distress. She has no wheezes. She has no rales.  Breast: deferred   Abdominal: Soft. She exhibits no distension. There is no tenderness.  Lymphadenopathy: She has no cervical adenopathy.  Skin: Skin is warm and dry. She is not diaphoretic.  Psychiatric: She has a normal mood and affect. Her behavior is normal.     Lab Results  Component Value Date   WBC 4.4 03/21/2014   HGB 14.5 03/06/2015   HCT 45 03/06/2015   PLT 160 03/06/2015   GLUCOSE 94 03/21/2014   CHOL 208 (A) 03/06/2015   TRIG 83 03/06/2015   HDL 67 03/06/2015   LDLCALC 115 03/06/2015   ALT 22 03/06/2015   AST 23 03/06/2015   NA 143 03/06/2015   K 4.2 03/06/2015   CL 105 03/21/2014   CREATININE 0.7 03/06/2015   BUN 12 03/06/2015   CO2 23 03/21/2014   TSH 1.37 03/21/2014  Assessment & Plan:   Physical exam: Screening blood work  reviewed - scanned in chart Exercise   Weight   Substance abuse  none   Reviewed recommended immunizations.   Health Maintenance  Topic Date Due   COVID-19 Vaccine (4 - Moderna risk series) 05/11/2020   INFLUENZA VACCINE  12/09/2021   COLONOSCOPY (Pts 45-37yr Insurance coverage will need to be confirmed)  03/01/2022   PAP SMEAR-Modifier  02/22/2023   DEXA SCAN  05/27/2023   MAMMOGRAM  03/23/2024   Hepatitis C Screening  Completed   HIV Screening  Completed   HPV VACCINES  Aged Out   Zoster Vaccines- Shingrix  Discontinued          See Problem List for Assessment and Plan of chronic medical problems.

## 2022-04-01 ENCOUNTER — Other Ambulatory Visit: Payer: Self-pay

## 2022-04-01 ENCOUNTER — Ambulatory Visit (INDEPENDENT_AMBULATORY_CARE_PROVIDER_SITE_OTHER): Payer: Commercial Managed Care - PPO | Admitting: Internal Medicine

## 2022-04-01 VITALS — BP 108/80 | HR 66 | Temp 98.6°F | Ht 62.0 in | Wt 165.0 lb

## 2022-04-01 DIAGNOSIS — K219 Gastro-esophageal reflux disease without esophagitis: Secondary | ICD-10-CM | POA: Diagnosis not present

## 2022-04-01 DIAGNOSIS — M810 Age-related osteoporosis without current pathological fracture: Secondary | ICD-10-CM

## 2022-04-01 DIAGNOSIS — E782 Mixed hyperlipidemia: Secondary | ICD-10-CM

## 2022-04-01 DIAGNOSIS — K2100 Gastro-esophageal reflux disease with esophagitis, without bleeding: Secondary | ICD-10-CM

## 2022-04-01 DIAGNOSIS — Z0000 Encounter for general adult medical examination without abnormal findings: Secondary | ICD-10-CM | POA: Diagnosis not present

## 2022-04-01 MED ORDER — PRAVASTATIN SODIUM 40 MG PO TABS: 40.0000 mg | ORAL_TABLET | Freq: Every day | ORAL | 3 refills | Status: DC

## 2022-04-01 MED ORDER — PANTOPRAZOLE SODIUM 40 MG PO TBEC: 40.0000 mg | DELAYED_RELEASE_TABLET | Freq: Every day | ORAL | 3 refills | Status: DC

## 2022-04-01 NOTE — Assessment & Plan Note (Signed)
Chronic Regular exercise and healthy diet encouraged Recent blood work-total cholesterol 188, triglycerides 117, HDL 48, LDL 119 Continue pravastatin 40 mg daily

## 2022-04-01 NOTE — Assessment & Plan Note (Signed)
Chronic GERD controlled Continue pantoprazole 40 mg daily 

## 2022-04-01 NOTE — Assessment & Plan Note (Signed)
Chronic DEXA up-to-date Stressed regular exercise Continue calcium and vitamin D Vitamin D level 60.3 with recent blood work

## 2022-05-26 ENCOUNTER — Encounter: Payer: Self-pay | Admitting: Gastroenterology

## 2022-06-03 ENCOUNTER — Encounter: Payer: Self-pay | Admitting: Gastroenterology

## 2022-07-03 ENCOUNTER — Ambulatory Visit (AMBULATORY_SURGERY_CENTER): Payer: Commercial Managed Care - PPO

## 2022-07-03 VITALS — Ht 62.0 in | Wt 150.0 lb

## 2022-07-03 DIAGNOSIS — N951 Menopausal and female climacteric states: Secondary | ICD-10-CM | POA: Insufficient documentation

## 2022-07-03 DIAGNOSIS — N952 Postmenopausal atrophic vaginitis: Secondary | ICD-10-CM | POA: Insufficient documentation

## 2022-07-03 DIAGNOSIS — Z86010 Personal history of colonic polyps: Secondary | ICD-10-CM

## 2022-07-03 DIAGNOSIS — E663 Overweight: Secondary | ICD-10-CM | POA: Insufficient documentation

## 2022-07-03 MED ORDER — NA SULFATE-K SULFATE-MG SULF 17.5-3.13-1.6 GM/177ML PO SOLN: 1.0000 | Freq: Once | ORAL | 0 refills | Status: AC

## 2022-07-03 NOTE — Progress Notes (Signed)
No egg or soy allergy known to patient  No issues known to pt with past sedation with any surgeries or procedures Patient denies ever being told they had issues or difficulty with intubation  No FH of Malignant Hyperthermia Pt is not on diet pills Pt is not on  home 02  Pt is not on blood thinners  Pt has a hx of constipation. Pt reports she takes Miralax daily with a stool softener. Has a BM daily no straining or hard stools  No A fib or A flutter Have any cardiac testing pending--no  Pt instructed to use Singlecare.com or GoodRx for a price reduction on prep   Patient's chart reviewed by Osvaldo Angst CNRA prior to previsit and patient appropriate for the [facility].  Previsit completed and red dot placed by patient's name on their procedure day (on provider's schedule).

## 2022-07-06 ENCOUNTER — Encounter: Payer: Self-pay | Admitting: Gastroenterology

## 2022-07-20 ENCOUNTER — Ambulatory Visit (AMBULATORY_SURGERY_CENTER): Payer: Commercial Managed Care - PPO | Admitting: Gastroenterology

## 2022-07-20 ENCOUNTER — Encounter: Payer: Self-pay | Admitting: Gastroenterology

## 2022-07-20 VITALS — BP 112/61 | HR 57 | Temp 96.8°F | Resp 11

## 2022-07-20 DIAGNOSIS — D124 Benign neoplasm of descending colon: Secondary | ICD-10-CM

## 2022-07-20 DIAGNOSIS — Z09 Encounter for follow-up examination after completed treatment for conditions other than malignant neoplasm: Secondary | ICD-10-CM

## 2022-07-20 DIAGNOSIS — D123 Benign neoplasm of transverse colon: Secondary | ICD-10-CM | POA: Diagnosis not present

## 2022-07-20 DIAGNOSIS — Z86010 Personal history of colonic polyps: Secondary | ICD-10-CM

## 2022-07-20 DIAGNOSIS — K635 Polyp of colon: Secondary | ICD-10-CM | POA: Diagnosis not present

## 2022-07-20 MED ORDER — SODIUM CHLORIDE 0.9 % IV SOLN: 500.0000 mL | Freq: Once | INTRAVENOUS | Status: DC

## 2022-07-20 NOTE — Progress Notes (Signed)
Pt's states no medical or surgical changes since previsit or office visit. 

## 2022-07-20 NOTE — Progress Notes (Signed)
History & Physical  Primary Care Physician:  Binnie Rail, MD Primary Gastroenterologist: Lucio Edward, MD  Impression / Plan:  Personal history of sessile serrated colon polyps and family history of colon cancer for colonoscopy.  CHIEF COMPLAINT:  [facility], Personal history of colon polyps   HPI: CING KUNDA is a 62 y.o. female with a personal history of sessile serrated colon polyps and family history of colon cancer for colonoscopy.   Past Medical History:  Diagnosis Date   Cataract    CTS (carpal tunnel syndrome)    overnight splints   FH: colonic polyps    Heart murmur    Hyperlipidemia    Osteoporosis    Plantar fasciitis 2012   partial tear on MRI ; Dr Steffanie Rainwater & Dr Stefanie Libel   PPD positive, treated 1994   6 months INH   Retina disorder    scarring, left eye   Squamous cell carcinoma 6 & 10 / 2013   cryotherapy & topical cream ; Dr Tonia Brooms    Past Surgical History:  Procedure Laterality Date   CATARACT EXTRACTION Bilateral 2016   [place], 1997   x2   COLONOSCOPY W/ POLYPECTOMY  2003 & 2013   negative 2008,  Dr Fuller Plan, due 2018   G3  P2     [place]   retina pexy  2015   for pin hole retinla bleed; Dr Gershon Crane   TUBAL LIGATION     VEIN BYPASS SURGERY Bilateral    VITRECTOMY Right 2016   VITRECTOMY Left 04/17/2019, 08/07/2019   wide resection     R shoulder pre malignant  lesion; Dr Tonia Brooms    Prior to Admission medications   Medication Sig Start Date End Date Taking? Authorizing Provider  aspirin 81 MG tablet Take 81 mg by mouth daily.    Yes [provider]  b complex vitamins tablet Take 1 tablet by mouth daily.   Yes [provider]  brimonidine (ALPHAGAN) 0.2 % ophthalmic solution Place 1 drop into the left eye 3 (three) times daily.   Yes [provider]  calcium carbonate (OS-CAL) 600 MG TABS Take 600 mg by mouth daily. With Vit D 800 units   Yes [provider]  Dorzolamide  HCl-Timolol Mal PF 2-0.5 % SOLN INSTILL 1 DROP INTO LEFT EYE TWICE DAILY 03/12/22  Yes [provider]  KRILL OIL PO Take 1 tablet by mouth daily.   Yes [provider]  Lecithin 400 MG CAPS Take 400 mg by mouth.    Yes [provider]  Multiple Vitamins-Minerals (MULTIVITAL PO) Take by mouth.   Yes [provider]  Omega-3 Fatty Acids (FISH OIL) 1000 MG CAPS Take by mouth daily.   Yes [provider]  pantoprazole (PROTONIX) 40 MG tablet Take 1 tablet (40 mg total) by mouth daily. 04/01/22  Yes Burns, Claudina Lick, MD  polyethylene glycol powder (GLYCOLAX/MIRALAX) powder Take 17 g by mouth 2 (two) times daily as needed. 05/12/16  Yes Burns, Claudina Lick, MD  pravastatin (PRAVACHOL) 40 MG tablet Take 1 tablet (40 mg total) by mouth daily. 04/01/22  Yes Burns, Claudina Lick, MD  timolol (TIMOPTIC) 0.5 % ophthalmic solution Place 1 drop into the left eye daily. 10/13/20  Yes [provider]    Current Outpatient Medications  Medication Sig Dispense Refill   aspirin 81 MG tablet Take 81 mg by mouth daily.      b complex vitamins tablet  Take 1 tablet by mouth daily.     brimonidine (ALPHAGAN) 0.2 % ophthalmic solution Place 1 drop into the left eye 3 (three) times daily.     calcium carbonate (OS-CAL) 600 MG TABS Take 600 mg by mouth daily. With Vit D 800 units     Dorzolamide HCl-Timolol Mal PF 2-0.5 % SOLN INSTILL 1 DROP INTO LEFT EYE TWICE DAILY     KRILL OIL PO Take 1 tablet by mouth daily.     Lecithin 400 MG CAPS Take 400 mg by mouth.      Multiple Vitamins-Minerals (MULTIVITAL PO) Take by mouth.     Omega-3 Fatty Acids (FISH OIL) 1000 MG CAPS Take by mouth daily.     pantoprazole (PROTONIX) 40 MG tablet Take 1 tablet (40 mg total) by mouth daily. 90 tablet 3   polyethylene glycol powder (GLYCOLAX/MIRALAX) powder Take 17 g by mouth 2 (two) times daily as needed. 3350 g 1   pravastatin (PRAVACHOL) 40 MG tablet Take 1 tablet (40 mg total) by mouth daily. 90  tablet 3   timolol (TIMOPTIC) 0.5 % ophthalmic solution Place 1 drop into the left eye daily.     Current Facility-Administered Medications  Medication Dose Route Frequency Provider Last Rate Last Admin   0.9 %  sodium chloride infusion  500 mL Intravenous Once Ladene Artist, MD        Allergies as of 07/20/2022 - Review Complete 07/20/2022  Allergen Reaction Noted   Corticosteroids  07/03/2022   Dorzolamide hcl-timolol mal Other (See Comments) 06/15/2019   Prednisolone Other (See Comments) 10/20/2021   Netarsudil Other (See Comments) 01/12/2020   Rosuvastatin      Family History  Problem Relation Age of Onset   Diabetes Mother    Hypertension Mother    Fibroids Mother        uterine   Melanoma Father    Colon polyps Father    Hypertension Sister    Skin cancer Sister        melanoma, basal cell    Arthritis Maternal Aunt        Rheumatoid   Hypertension Maternal Aunt    Hyperlipidemia Paternal Aunt    Osteopenia Paternal Aunt    Cancer Paternal Aunt        melanoma,ovarian   Heart attack Maternal Grandmother 76   Hypertension Maternal Grandmother    Hyperlipidemia Maternal Grandmother    Diabetes Maternal Grandmother    Stroke Maternal Grandmother 76   Thyroid disease Maternal Grandmother    Heart attack Maternal Grandfather        MI in 9s   Colon cancer Paternal Grandfather 70   Esophageal cancer Neg Hx    Pancreatic cancer Neg Hx    Prostate cancer Neg Hx    Rectal cancer Neg Hx    Stomach cancer Neg Hx     Social History   Socioeconomic History   Marital status: Married    Spouse name: Not on file   Number of children: 2   Years of education: Not on file   Highest education level: Not on file  Occupational History   Occupation: Nurse    Employer: [employer]  Tobacco Use   Smoking status: Never   Smokeless tobacco: Never   Tobacco comments:    but second hand smoke exposure  Vaping Use   Vaping Use: Never used  Substance  and Sexual Activity   Alcohol use: No   Drug use: No  Sexual activity: Not on file  Other Topics Concern   Not on file  Social History Narrative   Gets reg exercise         Social Determinants of Health   Financial Resource Strain: Not on file  Food Insecurity: Not on file  Transportation Needs: Not on file  Physical Activity: Not on file  Stress: Not on file  Social Connections: Not on file  Intimate Partner Violence: Not on file    Review of Systems:  All systems reviewed were negative except where noted in HPI.   Physical Exam: General:  Alert, well-developed, in NAD Head:  Normocephalic and atraumatic. Eyes:  Sclera clear, no icterus.   Conjunctiva pink. Ears:  Normal auditory acuity. Mouth:  No deformity or lesions.  Neck:  Supple; no masses. Lungs:  Clear throughout to auscultation.   No wheezes, crackles, or rhonchi.  Heart:  Regular rate and rhythm; no murmurs. Abdomen:  Soft, nondistended, nontender. No masses, hepatomegaly. No palpable masses.  Normal bowel sounds.    Rectal:  Deferred   Msk:  Symmetrical without gross deformities. Extremities:  Without edema. Neurologic:  Alert and  oriented x 4; grossly normal neurologically. Skin:  Intact without significant lesions or rashes. Psych:  Alert and cooperative. Normal mood and affect.   Pricilla Riffle. Fuller Plan  07/20/2022, 1:57 PM See Shea Evans, [facility], to contact our on call provider

## 2022-07-20 NOTE — Patient Instructions (Signed)
Handouts provided on polyps and diverticulosis.   Repeat colonoscopy after studies are complete for surveillance based on pathology results.  Resume previous diet.  Continue present medications.  Await pathology results.   YOU HAD AN ENDOSCOPIC PROCEDURE TODAY AT [facility]:   Refer to the procedure report that was given to you for any specific questions about what was found during the examination.  If the procedure report does not answer your questions, please call your gastroenterologist to clarify.  If you requested that your care partner not be given the details of your procedure findings, then the procedure report has been included in a sealed envelope for you to review at your convenience later.  YOU SHOULD EXPECT: Some feelings of bloating in the abdomen. Passage of more gas than usual.  Walking can help get rid of the air that was put into your GI tract during the procedure and reduce the bloating. If you had a lower endoscopy (such as a colonoscopy or flexible sigmoidoscopy) you may notice spotting of blood in your stool or on the toilet paper. If you underwent a bowel prep for your procedure, you may not have a normal bowel movement for a few days.  Please Note:  You might notice some irritation and congestion in your nose or some drainage.  This is from the oxygen used during your procedure.  There is no need for concern and it should clear up in a day or so.  SYMPTOMS TO REPORT IMMEDIATELY:  Following lower endoscopy (colonoscopy or flexible sigmoidoscopy):  Excessive amounts of blood in the stool  Significant tenderness or worsening of abdominal pains  Swelling of the abdomen that is new, acute  Fever of 100F or higher  For urgent or emergent issues, a gastroenterologist can be reached at any hour by calling [phone]. Do not use MyChart messaging for urgent concerns.    DIET:  We do recommend a small meal at first, but then you may proceed to your  regular diet.  Drink plenty of fluids but you should avoid alcoholic beverages for 24 hours.  ACTIVITY:  You should plan to take it easy for the rest of today and you should NOT DRIVE or use heavy machinery until tomorrow (because of the sedation medicines used during the test).    FOLLOW UP: Our staff will call the number listed on your records the next business day following your procedure.  We will call around 7:15- 8:00 am to check on you and address any questions or concerns that you may have regarding the information given to you following your procedure. If we do not reach you, we will leave a message.     If any biopsies were taken you will be contacted by phone or by letter within the next 1-3 weeks.  Please call us at [phone] if you have not heard about the biopsies in 3 weeks.    SIGNATURES/CONFIDENTIALITY: You and/or your care partner have signed paperwork which will be entered into your electronic medical record.  These signatures attest to the fact that that the information above on your After Visit Summary has been reviewed and is understood.  Full responsibility of the confidentiality of this discharge information lies with you and/or your care-partner.

## 2022-07-20 NOTE — Progress Notes (Signed)
Vss nad trans to pacu 

## 2022-07-20 NOTE — Op Note (Signed)
[facility] Patient Name: Yvonne Hall Procedure Date: 07/20/2022 1:51 PM MRN: [id number] Endoscopist: Ladene Artist , MD, [id number] Age: 63 Referring MD:  Date of Birth: 1961/01/29 Gender: Female Account #: [id number] Procedure:                Colonoscopy Indications:              High risk colon cancer surveillance: Personal                            history of sessile serrated colon polyp (less than                            10 mm in size) with no dysplasia Medicines:                Monitored Anesthesia Care Procedure:                Pre-Anesthesia Assessment:                           - Prior to the procedure, a History and Physical                            was performed, and patient medications and                            allergies were reviewed. The patient's tolerance of                            previous anesthesia was also reviewed. The risks                            and benefits of the procedure and the sedation                            options and risks were discussed with the patient.                            All questions were answered, and informed consent                            was obtained. Prior Anticoagulants: The patient has                            taken no anticoagulant or antiplatelet agents. ASA                            Grade Assessment: II - A patient with mild systemic                            disease. After reviewing the risks and benefits,                            the patient was deemed in satisfactory condition to  undergo the procedure.                           After obtaining informed consent, the colonoscope                            was passed under direct vision. Throughout the                            procedure, the patient's blood pressure, pulse, and                            oxygen saturations were monitored continuously. The                            CF [id number] was  introduced through the anus                            and advanced to the the cecum, identified by                            appendiceal orifice and ileocecal valve. The                            ileocecal valve, appendiceal orifice, and rectum                            were photographed. The quality of the bowel                            preparation was good. The colonoscopy was performed                            without difficulty. The patient tolerated the                            procedure well. Scope In: 2:05:30 PM Scope Out: 2:26:03 PM Scope Withdrawal Time: 0 hours 15 minutes 40 seconds  Total Procedure Duration: 0 hours 20 minutes 33 seconds  Findings:                 The perianal and digital rectal examinations were                            normal.                           Three sessile polyps were found in the descending                            colon (1) and transverse colon (2). The polyps were                            5 to 6 mm in size. These polyps were removed with a  cold snare. Resection and retrieval were complete.                           Multiple small-mouthed diverticula were found in                            the left colon. There was no evidence of                            diverticular bleeding.                           The exam was otherwise without abnormality on                            direct and retroflexion views. Complications:            No immediate complications. Estimated blood loss:                            None. Estimated Blood Loss:     Estimated blood loss: none. Impression:               - Three 5 to 6 mm polyps in the descending colon                            and in the transverse colon, removed with a cold                            snare. Resected and retrieved.                           - Mild diverticulosis in the left colon.                           - The examination was otherwise normal  on direct                            and retroflexion views. Recommendation:           - Repeat colonoscopy after studies are complete for                            surveillance based on pathology results.                           - Patient has a contact number available for                            emergencies. The signs and symptoms of potential                            delayed complications were discussed with the                            patient. Return to normal activities tomorrow.  Written discharge instructions were provided to the                            patient.                           - Resume previous diet.                           - Continue present medications.                           - Await pathology results. Ladene Artist, MD 07/20/2022 2:29:41 PM This report has been signed electronically.

## 2022-07-21 ENCOUNTER — Telehealth: Payer: Self-pay

## 2022-07-21 NOTE — Telephone Encounter (Signed)
Attempted follow up call; no answer; left VM. 

## 2022-07-28 ENCOUNTER — Encounter: Payer: Self-pay | Admitting: Gastroenterology

## 2023-02-04 ENCOUNTER — Other Ambulatory Visit: Payer: Self-pay | Admitting: Obstetrics & Gynecology

## 2023-02-04 DIAGNOSIS — Z1231 Encounter for screening mammogram for malignant neoplasm of breast: Secondary | ICD-10-CM

## 2023-02-27 LAB — LAB REPORT - SCANNED
A1c: 5.4
EGFR: 99

## 2023-03-01 ENCOUNTER — Other Ambulatory Visit: Payer: Self-pay | Admitting: Internal Medicine

## 2023-03-01 DIAGNOSIS — K2100 Gastro-esophageal reflux disease with esophagitis, without bleeding: Secondary | ICD-10-CM

## 2023-03-30 ENCOUNTER — Ambulatory Visit
Admission: RE | Admit: 2023-03-30 | Discharge: 2023-03-30 | Disposition: A | Discharge status: Home / Self Care | Payer: Commercial Managed Care - PPO | Source: Ambulatory Visit | Attending: Obstetrics & Gynecology | Admitting: Obstetrics & Gynecology

## 2023-03-30 DIAGNOSIS — Z1231 Encounter for screening mammogram for malignant neoplasm of breast: Secondary | ICD-10-CM

## 2023-04-02 ENCOUNTER — Encounter: Payer: Self-pay | Admitting: Internal Medicine

## 2023-04-04 ENCOUNTER — Encounter: Payer: Self-pay | Admitting: Internal Medicine

## 2023-04-04 NOTE — Progress Notes (Unsigned)
Subjective:    Patient ID: Yvonne Hall, female    DOB: 1960-09-16, 62 y.o.   MRN: [id number]      HPI Isra is here for a Physical exam and her chronic medical problems.    Had labs done at work.  No concerns.   Medications and allergies reviewed with patient and updated if appropriate.  Current Outpatient Medications on File Prior to Visit  Medication Sig Dispense Refill   aspirin 81 MG tablet Take 81 mg by mouth daily.      b complex vitamins tablet Take 1 tablet by mouth daily.     brimonidine (ALPHAGAN) 0.2 % ophthalmic solution Place 1 drop into the left eye 3 (three) times daily.     calcium carbonate (OS-CAL) 600 MG TABS Take 600 mg by mouth daily. With Vit D 800 units     Dorzolamide HCl-Timolol Mal PF 2-0.5 % SOLN INSTILL 1 DROP INTO LEFT EYE TWICE DAILY     estradiol (ESTRACE) 0.1 MG/GM vaginal cream Insert 1/2 - 1 gram in vagina up to twice weekly and place pea sized amount on upper vagina area as well twice weekly     KRILL OIL PO Take 1 tablet by mouth daily.     Lecithin 400 MG CAPS Take 400 mg by mouth.      Multiple Vitamins-Minerals (MULTIVITAL PO) Take by mouth.     nystatin cream (MYCOSTATIN) APPLY TO THE AFFECTED AREA(S) BY TOPICAL ROUTE 2 TIMES PER DAY     Omega-3 Fatty Acids (FISH OIL) 1000 MG CAPS Take by mouth daily.     pantoprazole (PROTONIX) 40 MG tablet Take 1 tablet by mouth once daily 90 tablet 0   polyethylene glycol powder (GLYCOLAX/MIRALAX) powder Take 17 g by mouth 2 (two) times daily as needed. 3350 g 1   pravastatin (PRAVACHOL) 40 MG tablet Take 1 tablet by mouth once daily 90 tablet 0   timolol (TIMOPTIC) 0.5 % ophthalmic solution Place 1 drop into the left eye daily.     triamcinolone cream (KENALOG) 0.1 % APPLY A THIN LAYER TO THE AFFECTED AREA(S) BY TOPICAL ROUTE 2 TIMES PER DAY     No current facility-administered medications on file prior to visit.    Review of Systems  Constitutional:  Negative for fever.  Eyes:  Positive  for visual disturbance (sees 2 different eye doctors).  Respiratory:  Negative for cough, shortness of breath and wheezing.   Cardiovascular:  Positive for leg swelling (LLE mild edema - per [place] vein mild lymphedema). Negative for chest pain and palpitations.  Gastrointestinal:  Negative for abdominal pain, blood in stool, constipation and diarrhea.       Gerd controlled  Genitourinary:  Negative for dysuria.  Musculoskeletal:  Negative for arthralgias and back pain.  Skin:  Negative for rash.  Neurological:  Positive for headaches (related to eyes). Negative for dizziness and light-headedness.  Psychiatric/Behavioral:  Negative for dysphoric mood. The patient is not nervous/anxious.        Objective:   Vitals:   04/05/23 0803  BP: 110/80  Pulse: 76  Temp: 98.8 F (37.1 C)  SpO2: 96%   Filed Weights   04/05/23 0803  Weight: 164 lb (74.4 kg)   Body mass index is 30 kg/m.  BP Readings from Last 3 Encounters:  04/05/23 110/80  07/20/22 112/61  04/01/22 108/80    Wt Readings from Last 3 Encounters:  04/05/23 164 lb (74.4 kg)  07/03/22 150 lb (68 kg)  04/01/22 165 lb (74.8 kg)       Physical Exam Constitutional: She appears well-developed and well-nourished. No distress.  HENT:  Head: Normocephalic and atraumatic.  Right Ear: External ear normal. Normal ear canal and TM Left Ear: External ear normal.  Normal ear canal and TM Mouth/Throat: Oropharynx is clear and moist.  Eyes: Conjunctivae normal.  Neck: Neck supple. No tracheal deviation present. No thyromegaly present.  No carotid bruit  Cardiovascular: Normal rate, regular rhythm and normal heart sounds.   No murmur heard.  No edema. Pulmonary/Chest: Effort normal and breath sounds normal. No respiratory distress. She has no wheezes. She has no rales.  Breast: deferred   Abdominal: Soft. She exhibits no distension. There is no tenderness.  Lymphadenopathy: She has no cervical adenopathy.  Skin: Skin is  warm and dry. She is not diaphoretic.  Psychiatric: She has a normal mood and affect. Her behavior is normal.     Lab Results  Component Value Date   WBC 4.4 03/21/2014   HGB 14.5 03/06/2015   HCT 45 03/06/2015   PLT 160 03/06/2015   GLUCOSE 94 03/21/2014   CHOL 208 (A) 03/06/2015   TRIG 83 03/06/2015   HDL 67 03/06/2015   LDLCALC 115 03/06/2015   ALT 22 03/06/2015   AST 23 03/06/2015   NA 143 03/06/2015   K 4.2 03/06/2015   CL 105 03/21/2014   CREATININE 0.7 03/06/2015   BUN 12 03/06/2015   CO2 23 03/21/2014   TSH 1.37 03/21/2014         Assessment & Plan:   Physical exam: Screening blood work  done at work - reviewed. Exercise  regular - walking when weather permits during lunch Weight  is good Substance abuse  none   Reviewed recommended immunizations.  Up to date via work   Health Maintenance  Topic Date Due   DTaP/Tdap/Td (2 - Tdap) 03/21/2018   Cervical Cancer Screening (HPV/Pap Cotest)  02/03/2019   COVID-19 Vaccine (4 - 2023-24 season) 04/21/2023 (Originally 01/10/2023)   DEXA SCAN  05/27/2023   MAMMOGRAM  03/29/2025   Colonoscopy  07/20/2027   INFLUENZA VACCINE  Completed   Hepatitis C Screening  Completed   HIV Screening  Completed   HPV VACCINES  Aged Out   Zoster Vaccines- Shingrix  Discontinued      Pap up to date - requested report    See Problem List for Assessment and Plan of chronic medical problems.

## 2023-04-04 NOTE — Patient Instructions (Addendum)
Medications changes include :   None    A referral was ordered and someone will call you to schedule an appointment.     Return in about 1 year (around 04/04/2024) for Physical Exam.    Health Maintenance, Female Adopting a healthy lifestyle and getting preventive care are important in promoting health and wellness. Ask your health care provider about: The right schedule for you to have regular tests and exams. Things you can do on your own to prevent diseases and keep yourself healthy. What should I know about diet, weight, and exercise? Eat a healthy diet  Eat a diet that includes plenty of vegetables, fruits, low-fat dairy products, and lean protein. Do not eat a lot of foods that are high in solid fats, added sugars, or sodium. Maintain a healthy weight Body mass index (BMI) is used to identify weight problems. It estimates body fat based on height and weight. Your health care provider can help determine your BMI and help you achieve or maintain a healthy weight. Get regular exercise Get regular exercise. This is one of the most important things you can do for your health. Most adults should: Exercise for at least 150 minutes each week. The exercise should increase your heart rate and make you sweat (moderate-intensity exercise). Do strengthening exercises at least twice a week. This is in addition to the moderate-intensity exercise. Spend less time sitting. Even light physical activity can be beneficial. Watch cholesterol and blood lipids Have your blood tested for lipids and cholesterol at 63 years of age, then have this test every 5 years. Have your cholesterol levels checked more often if: Your lipid or cholesterol levels are high. You are older than 63 years of age. You are at high risk for heart disease. What should I know about cancer screening? Depending on your health history and family history, you may need to have cancer screening at various ages. This  may include screening for: Breast cancer. Cervical cancer. Colorectal cancer. Skin cancer. Lung cancer. What should I know about heart disease, diabetes, and high blood pressure? Blood pressure and heart disease High blood pressure causes heart disease and increases the risk of stroke. This is more likely to develop in people who have high blood pressure readings or are overweight. Have your blood pressure checked: Every 3-5 years if you are 58-17 years of age. Every year if you are 38 years old or older. Diabetes Have regular diabetes screenings. This checks your fasting blood sugar level. Have the screening done: Once every three years after age 75 if you are at a normal weight and have a low risk for diabetes. More often and at a younger age if you are overweight or have a high risk for diabetes. What should I know about preventing infection? Hepatitis B If you have a higher risk for hepatitis B, you should be screened for this virus. Talk with your health care provider to find out if you are at risk for hepatitis B infection. Hepatitis C Testing is recommended for: Everyone born from [birth year] through 1965. Anyone with known risk factors for hepatitis C. Sexually transmitted infections (STIs) Get screened for STIs, including gonorrhea and chlamydia, if: You are sexually active and are younger than 63 years of age. You are older than 63 years of age and your health care provider tells you that you are at risk for this type of infection. Your sexual activity has changed since you were last screened, and  you are at increased risk for chlamydia or gonorrhea. Ask your health care provider if you are at risk. Ask your health care provider about whether you are at high risk for HIV. Your health care provider may recommend a prescription medicine to help prevent HIV infection. If you choose to take medicine to prevent HIV, you should first get tested for HIV. You should then be tested every 3  months for as long as you are taking the medicine. Pregnancy If you are about to stop having your period (premenopausal) and you may become pregnant, seek counseling before you get pregnant. Take 400 to 800 micrograms (mcg) of folic acid every day if you become pregnant. Ask for birth control (contraception) if you want to prevent pregnancy. Osteoporosis and menopause Osteoporosis is a disease in which the bones lose minerals and strength with aging. This can result in bone fractures. If you are 72 years old or older, or if you are at risk for osteoporosis and fractures, ask your health care provider if you should: Be screened for bone loss. Take a calcium or vitamin D supplement to lower your risk of fractures. Be given hormone replacement therapy (HRT) to treat symptoms of menopause. Follow these instructions at home: Alcohol use Do not drink alcohol if: Your health care provider tells you not to drink. You are pregnant, may be pregnant, or are planning to become pregnant. If you drink alcohol: Limit how much you have to: 0-1 drink a day. Know how much alcohol is in your drink. In the [place] one drink equals one 12 oz bottle of beer (355 mL), one 5 oz glass of wine (148 mL), or one 1 oz glass of hard liquor (44 mL). Lifestyle Do not use any products that contain nicotine or tobacco. These products include cigarettes, chewing tobacco, and vaping devices, such as e-cigarettes. If you need help quitting, ask your health care provider. Do not use street drugs. Do not share needles. Ask your health care provider for help if you need support or information about quitting drugs. General instructions Schedule regular health, dental, and eye exams. Stay current with your vaccines. Tell your health care provider if: You often feel depressed. You have ever been abused or do not feel safe at home. Summary Adopting a healthy lifestyle and getting preventive care are important in promoting health  and wellness. Follow your health care provider's instructions about healthy diet, exercising, and getting tested or screened for diseases. Follow your health care provider's instructions on monitoring your cholesterol and blood pressure. This information is not intended to replace advice given to you by your health care provider. Make sure you discuss any questions you have with your health care provider. Document Revised: 09/16/2020 Document Reviewed: 09/16/2020 Elsevier Patient Education  2024 [employer].

## 2023-04-05 ENCOUNTER — Ambulatory Visit (INDEPENDENT_AMBULATORY_CARE_PROVIDER_SITE_OTHER): Payer: Commercial Managed Care - PPO | Admitting: Internal Medicine

## 2023-04-05 VITALS — BP 110/80 | HR 76 | Temp 98.8°F | Ht 62.0 in | Wt 164.0 lb

## 2023-04-05 DIAGNOSIS — K219 Gastro-esophageal reflux disease without esophagitis: Secondary | ICD-10-CM | POA: Diagnosis not present

## 2023-04-05 DIAGNOSIS — Z0000 Encounter for general adult medical examination without abnormal findings: Secondary | ICD-10-CM | POA: Diagnosis not present

## 2023-04-05 DIAGNOSIS — E782 Mixed hyperlipidemia: Secondary | ICD-10-CM

## 2023-04-05 DIAGNOSIS — Z136 Encounter for screening for cardiovascular disorders: Secondary | ICD-10-CM

## 2023-04-05 DIAGNOSIS — M810 Age-related osteoporosis without current pathological fracture: Secondary | ICD-10-CM | POA: Diagnosis not present

## 2023-04-05 DIAGNOSIS — K2100 Gastro-esophageal reflux disease with esophagitis, without bleeding: Secondary | ICD-10-CM

## 2023-04-05 MED ORDER — PRAVASTATIN SODIUM 40 MG PO TABS: 40.0000 mg | ORAL_TABLET | Freq: Every day | ORAL | 3 refills | Status: DC

## 2023-04-05 MED ORDER — PANTOPRAZOLE SODIUM 40 MG PO TBEC: 40.0000 mg | DELAYED_RELEASE_TABLET | Freq: Every day | ORAL | 3 refills | Status: DC

## 2023-04-05 NOTE — Assessment & Plan Note (Signed)
Chronic DEXA up-to-date Stressed regular exercise Continue calcium and vitamin D Vitamin D level 72.2 with recent blood work

## 2023-04-05 NOTE — Assessment & Plan Note (Signed)
Chronic Regular exercise and healthy diet encouraged Recent blood work-total cholesterol 184, triglycerides 107, HDL 54, LDL 111 Continue pravastatin 40 mg daily

## 2023-04-05 NOTE — Addendum Note (Signed)
Addended by: Pincus Sanes on: 04/05/2023 08:44 AM   Modules accepted: Orders

## 2023-04-05 NOTE — Assessment & Plan Note (Signed)
Chronic GERD controlled Continue pantoprazole 40 mg daily

## 2023-04-06 ENCOUNTER — Ambulatory Visit (HOSPITAL_BASED_OUTPATIENT_CLINIC_OR_DEPARTMENT_OTHER)
Admission: RE | Admit: 2023-04-06 | Discharge: 2023-04-06 | Disposition: A | Discharge status: Home / Self Care | Payer: Commercial Managed Care - PPO | Source: Ambulatory Visit | Attending: Internal Medicine | Admitting: Internal Medicine

## 2023-04-06 DIAGNOSIS — Z136 Encounter for screening for cardiovascular disorders: Secondary | ICD-10-CM | POA: Insufficient documentation

## 2023-05-01 ENCOUNTER — Encounter: Payer: Self-pay | Admitting: Internal Medicine

## 2023-05-03 ENCOUNTER — Other Ambulatory Visit: Payer: Self-pay

## 2023-05-03 DIAGNOSIS — Z23 Encounter for immunization: Secondary | ICD-10-CM

## 2023-05-03 MED ORDER — PNEUMOCOCCAL 20-VAL CONJ VACC 0.5 ML IM SUSY: 0.5000 mL | PREFILLED_SYRINGE | INTRAMUSCULAR | 0 refills | Status: AC

## 2024-02-10 ENCOUNTER — Other Ambulatory Visit: Payer: Self-pay | Admitting: Obstetrics & Gynecology

## 2024-02-10 DIAGNOSIS — Z0000 Encounter for general adult medical examination without abnormal findings: Secondary | ICD-10-CM

## 2024-04-02 ENCOUNTER — Encounter: Payer: Self-pay | Admitting: Internal Medicine

## 2024-04-02 NOTE — Progress Notes (Unsigned)
 Subjective:    Patient ID: Yvonne Hall, female    DOB: Dec 09, 1960, 63 y.o.   MRN: [id number]      HPI Yvonne Hall is here for a Physical exam and her chronic medical problems.   Both middle fingers sore in the morning and contracted in the morning.  When she works and out in the morning they are fine the rest of the day.  It does hurt when they first opened up.  She has still has chronic bilateral hip pain.  Pain improves after getting up from a chair and walking a little bit, but first getting up is difficult.   Medications and allergies reviewed with patient and updated if appropriate.  Current Outpatient Medications on File Prior to Visit  Medication Sig Dispense Refill   aspirin 81 MG tablet Take 81 mg by mouth daily.      b complex vitamins tablet Take 1 tablet by mouth daily.     brimonidine (ALPHAGAN) 0.2 % ophthalmic solution Place 1 drop into the left eye 3 (three) times daily.     calcium carbonate (OS-CAL) 600 MG TABS Take 600 mg by mouth daily. With Vit D 800 units     Dorzolamide HCl-Timolol Mal PF 2-0.5 % SOLN INSTILL 1 DROP INTO LEFT EYE TWICE DAILY     estradiol (ESTRACE) 0.1 MG/GM vaginal cream Insert 1/2 - 1 gram in vagina up to twice weekly and place pea sized amount on upper vagina area as well twice weekly     KRILL OIL PO Take 1 tablet by mouth daily.     Lecithin 400 MG CAPS Take 400 mg by mouth.      Multiple Vitamins-Minerals (MULTIVITAL PO) Take by mouth.     nystatin cream (MYCOSTATIN) APPLY TO THE AFFECTED AREA(S) BY TOPICAL ROUTE 2 TIMES PER DAY     Omega-3 Fatty Acids (FISH OIL) 1000 MG CAPS Take by mouth daily.     polyethylene glycol powder (GLYCOLAX /MIRALAX ) powder Take 17 g by mouth 2 (two) times daily as needed. 3350 g 1   timolol (TIMOPTIC) 0.5 % ophthalmic solution Place 1 drop into the left eye daily.     triamcinolone cream (KENALOG) 0.1 % APPLY A THIN LAYER TO THE AFFECTED AREA(S) BY TOPICAL ROUTE 2 TIMES PER DAY     No current  facility-administered medications on file prior to visit.    Review of Systems  Constitutional:  Negative for fever.  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  Respiratory:  Negative for cough, shortness of breath and wheezing.   Cardiovascular:  Negative for chest pain, palpitations and leg swelling.  Gastrointestinal:  Negative for abdominal pain, blood in stool, constipation and diarrhea.       No gerd- controlled  Genitourinary:  Negative for dysuria.  Musculoskeletal:  Positive for arthralgias (middle fingers, hips). Negative for back pain.  Skin:  Negative for rash.  Neurological:  Negative for light-headedness and headaches.  Psychiatric/Behavioral:  Negative for dysphoric mood. The patient is not nervous/anxious.        Objective:   Vitals:   04/05/24 1308  BP: 124/84  Pulse: 84  Temp: 98.6 F (37 C)  SpO2: 98%   Filed Weights   04/05/24 1308  Weight: 153 lb (69.4 kg)   Body mass index is 27.98 kg/m.  BP Readings from Last 3 Encounters:  04/05/24 124/84  04/05/23 110/80  07/20/22 112/61    Wt Readings from Last 3 Encounters:  04/05/24  153 lb (69.4 kg)  04/05/23 164 lb (74.4 kg)  07/03/22 150 lb (68 kg)       Physical Exam Constitutional: She appears well-developed and well-nourished. No distress.  HENT:  Head: Normocephalic and atraumatic.  Right Ear: External ear normal. Normal ear canal and TM Left Ear: External ear normal.  Normal ear canal and TM Mouth/Throat: Oropharynx is clear and moist.  Eyes: Conjunctivae normal.  Neck: Neck supple. No tracheal deviation present. No thyromegaly present.  No carotid bruit  Cardiovascular: Normal rate, regular rhythm and normal heart sounds.   No murmur heard.  No edema. Pulmonary/Chest: Effort normal and breath sounds normal. No respiratory distress. She has no wheezes. She has no rales.  Breast: deferred   Abdominal: Soft. She exhibits no distension. There is no tenderness.   Lymphadenopathy: She has no cervical adenopathy.  Skin: Skin is warm and dry. She is not diaphoretic.  Psychiatric: She has a normal mood and affect. Her behavior is normal.     Lab Results  Component Value Date   WBC 4.4 03/21/2014   HGB 14.5 03/06/2015   HCT 45 03/06/2015   PLT 160 03/06/2015   GLUCOSE 94 03/21/2014   CHOL 208 (A) 03/06/2015   TRIG 83 03/06/2015   HDL 67 03/06/2015   LDLCALC 115 03/06/2015   ALT 22 03/06/2015   AST 23 03/06/2015   NA 143 03/06/2015   K 4.2 03/06/2015   CL 105 03/21/2014   CREATININE 0.7 03/06/2015   BUN 12 03/06/2015   CO2 23 03/21/2014   TSH 1.37 03/21/2014         Assessment & Plan:   Physical exam: Screening blood work  ordered Exercise  - very active - yard work, house work [employer]  overweight - is good Substance abuse  none   Reviewed recommended immunizations.   Health Maintenance  Topic Date Due   Pneumococcal Vaccine: 50+ Years (1 of 1 - PCV) Never done   DTaP/Tdap/Td (2 - Tdap) 03/21/2018   Cervical Cancer Screening (HPV/Pap Cotest)  02/03/2019   Bone Density Scan  05/27/2023   Influenza Vaccine  12/10/2023   COVID-19 Vaccine (4 - 2025-26 season) 04/18/2024 (Originally 01/10/2024)   Mammogram  03/29/2025   Colonoscopy  07/20/2027   Hepatitis C Screening  Completed   HIV Screening  Completed   Hepatitis B Vaccines 19-59 Average Risk  Aged Out   HPV VACCINES  Aged Out   Meningococcal B Vaccine  Aged Out   Zoster Vaccines- Shingrix  Discontinued        See Problem List for Assessment and Plan of chronic medical problems.

## 2024-04-02 NOTE — Patient Instructions (Addendum)
 Blood work was ordered.       Medications changes include :   None    A referral was ordered and someone will call you to schedule an appointment.     Return in about 1 year (around 04/05/2025) for Physical Exam, Schedule DEXA-[place].   Health Maintenance, Female Adopting a healthy lifestyle and getting preventive care are important in promoting health and wellness. Ask your health care provider about: The right schedule for you to have regular tests and exams. Things you can do on your own to prevent diseases and keep yourself healthy. What should I know about diet, weight, and exercise? Eat a healthy diet  Eat a diet that includes plenty of vegetables, fruits, low-fat dairy products, and lean protein. Do not eat a lot of foods that are high in solid fats, added sugars, or sodium. Maintain a healthy weight Body mass index (BMI) is used to identify weight problems. It estimates body fat based on height and weight. Your health care provider can help determine your BMI and help you achieve or maintain a healthy weight. Get regular exercise Get regular exercise. This is one of the most important things you can do for your health. Most adults should: Exercise for at least 150 minutes each week. The exercise should increase your heart rate and make you sweat (moderate-intensity exercise). Do strengthening exercises at least twice a week. This is in addition to the moderate-intensity exercise. Spend less time sitting. Even light physical activity can be beneficial. Watch cholesterol and blood lipids Have your blood tested for lipids and cholesterol at 64 years of age, then have this test every 5 years. Have your cholesterol levels checked more often if: Your lipid or cholesterol levels are high. You are older than 64 years of age. You are at high risk for heart disease. What should I know about cancer screening? Depending on your health history and family history, you may need  to have cancer screening at various ages. This may include screening for: Breast cancer. Cervical cancer. Colorectal cancer. Skin cancer. Lung cancer. What should I know about heart disease, diabetes, and high blood pressure? Blood pressure and heart disease High blood pressure causes heart disease and increases the risk of stroke. This is more likely to develop in people who have high blood pressure readings or are overweight. Have your blood pressure checked: Every 3-5 years if you are 89-65 years of age. Every year if you are 49 years old or older. Diabetes Have regular diabetes screenings. This checks your fasting blood sugar level. Have the screening done: Once every three years after age 21 if you are at a normal weight and have a low risk for diabetes. More often and at a younger age if you are overweight or have a high risk for diabetes. What should I know about preventing infection? Hepatitis B If you have a higher risk for hepatitis B, you should be screened for this virus. Talk with your health care provider to find out if you are at risk for hepatitis B infection. Hepatitis C Testing is recommended for: Everyone born from [birth year] through 1965. Anyone with known risk factors for hepatitis C. Sexually transmitted infections (STIs) Get screened for STIs, including gonorrhea and chlamydia, if: You are sexually active and are younger than 64 years of age. You are older than 64 years of age and your health care provider tells you that you are at risk for this type of infection. Your sexual  activity has changed since you were last screened, and you are at increased risk for chlamydia or gonorrhea. Ask your health care provider if you are at risk. Ask your health care provider about whether you are at high risk for HIV. Your health care provider may recommend a prescription medicine to help prevent HIV infection. If you choose to take medicine to prevent HIV, you should first get tested  for HIV. You should then be tested every 3 months for as long as you are taking the medicine. Pregnancy If you are about to stop having your period (premenopausal) and you may become pregnant, seek counseling before you get pregnant. Take 400 to 800 micrograms (mcg) of folic acid every day if you become pregnant. Ask for birth control (contraception) if you want to prevent pregnancy. Osteoporosis and menopause Osteoporosis is a disease in which the bones lose minerals and strength with aging. This can result in bone fractures. If you are 99 years old or older, or if you are at risk for osteoporosis and fractures, ask your health care provider if you should: Be screened for bone loss. Take a calcium or vitamin D  supplement to lower your risk of fractures. Be given hormone replacement therapy (HRT) to treat symptoms of menopause. Follow these instructions at home: Alcohol use Do not drink alcohol if: Your health care provider tells you not to drink. You are pregnant, may be pregnant, or are planning to become pregnant. If you drink alcohol: Limit how much you have to: 0-1 drink a day. Know how much alcohol is in your drink. In the [place] one drink equals one 12 oz bottle of beer (355 mL), one 5 oz glass of wine (148 mL), or one 1 oz glass of hard liquor (44 mL). Lifestyle Do not use any products that contain nicotine or tobacco. These products include cigarettes, chewing tobacco, and vaping devices, such as e-cigarettes. If you need help quitting, ask your health care provider. Do not use street drugs. Do not share needles. Ask your health care provider for help if you need support or information about quitting drugs. General instructions Schedule regular health, dental, and eye exams. Stay current with your vaccines. Tell your health care provider if: You often feel depressed. You have ever been abused or do not feel safe at home. Summary Adopting a healthy lifestyle and getting  preventive care are important in promoting health and wellness. Follow your health care provider's instructions about healthy diet, exercising, and getting tested or screened for diseases. Follow your health care provider's instructions on monitoring your cholesterol and blood pressure. This information is not intended to replace advice given to you by your health care provider. Make sure you discuss any questions you have with your health care provider. Document Revised: 09/16/2020 Document Reviewed: 09/16/2020 Elsevier Patient Education  2024 [employer].

## 2024-04-05 ENCOUNTER — Ambulatory Visit: Admitting: Internal Medicine

## 2024-04-05 ENCOUNTER — Ambulatory Visit: Payer: Self-pay | Admitting: Internal Medicine

## 2024-04-05 VITALS — BP 124/84 | HR 84 | Temp 98.6°F | Ht 62.0 in | Wt 153.0 lb

## 2024-04-05 DIAGNOSIS — M810 Age-related osteoporosis without current pathological fracture: Secondary | ICD-10-CM

## 2024-04-05 DIAGNOSIS — M6530 Trigger finger, unspecified finger: Secondary | ICD-10-CM | POA: Insufficient documentation

## 2024-04-05 DIAGNOSIS — E7800 Pure hypercholesterolemia, unspecified: Secondary | ICD-10-CM

## 2024-04-05 DIAGNOSIS — M65339 Trigger finger, unspecified middle finger: Secondary | ICD-10-CM

## 2024-04-05 DIAGNOSIS — M25551 Pain in right hip: Secondary | ICD-10-CM

## 2024-04-05 DIAGNOSIS — M25552 Pain in left hip: Secondary | ICD-10-CM

## 2024-04-05 DIAGNOSIS — K219 Gastro-esophageal reflux disease without esophagitis: Secondary | ICD-10-CM

## 2024-04-05 DIAGNOSIS — Z0000 Encounter for general adult medical examination without abnormal findings: Secondary | ICD-10-CM

## 2024-04-05 DIAGNOSIS — K2100 Gastro-esophageal reflux disease with esophagitis, without bleeding: Secondary | ICD-10-CM

## 2024-04-05 LAB — LIPID PANEL
Cholesterol: 162 mg/dL (ref 0–200)
HDL: 50.3 mg/dL (ref >39.00)
LDL Cholesterol: 83 mg/dL (ref 0–99)
NonHDL: 111.21
Total CHOL/HDL Ratio: 3
Triglycerides: 143 mg/dL (ref 0.0–149.0)
VLDL: 28.6 mg/dL (ref 0.0–40.0)

## 2024-04-05 LAB — CBC
HCT: 37.2 % (ref 36.0–46.0)
Hemoglobin: 12.5 g/dL (ref 12.0–15.0)
MCHC: 33.6 g/dL (ref 30.0–36.0)
MCV: 86.3 fl (ref 78.0–100.0)
Platelets: 183 K/uL (ref 150.0–400.0)
RBC: 4.31 Mil/uL (ref 3.87–5.11)
RDW: 12.9 % (ref 11.5–15.5)
WBC: 7.3 K/uL (ref 4.0–10.5)

## 2024-04-05 LAB — TSH: TSH: 1.89 u[IU]/mL (ref 0.35–5.50)

## 2024-04-05 LAB — COMPREHENSIVE METABOLIC PANEL WITH GFR
ALT: 16 U/L (ref 0–35)
AST: 19 U/L (ref 0–37)
Albumin: 4.5 g/dL (ref 3.5–5.2)
Alkaline Phosphatase: 40 U/L (ref 39–117)
BUN: 13 mg/dL (ref 6–23)
CO2: 28 meq/L (ref 19–32)
Calcium: 9.4 mg/dL (ref 8.4–10.5)
Chloride: 104 meq/L (ref 96–112)
Creatinine, Ser: 0.6 mg/dL (ref 0.40–1.20)
GFR: 94.95 mL/min (ref >60.00)
Glucose, Bld: 97 mg/dL (ref 70–99)
Potassium: 3.8 meq/L (ref 3.5–5.1)
Sodium: 140 meq/L (ref 135–145)
Total Bilirubin: 0.7 mg/dL (ref 0.2–1.2)
Total Protein: 6.9 g/dL (ref 6.0–8.3)

## 2024-04-05 LAB — VITAMIN D 25 HYDROXY (VIT D DEFICIENCY, FRACTURES): VITD: 62.83 ng/mL (ref 30.00–100.00)

## 2024-04-05 MED ORDER — PANTOPRAZOLE SODIUM 40 MG PO TBEC: 40.0000 mg | DELAYED_RELEASE_TABLET | Freq: Every day | ORAL | 3 refills | Status: AC

## 2024-04-05 MED ORDER — PRAVASTATIN SODIUM 40 MG PO TABS: 40.0000 mg | ORAL_TABLET | Freq: Every day | ORAL | 3 refills | Status: AC

## 2024-04-05 NOTE — Assessment & Plan Note (Signed)
Chronic ?DEXA due-ordered ?Stressed regular exercise ?Continue calcium and vitamin D ?Check vitamin D level ?

## 2024-04-05 NOTE — Assessment & Plan Note (Signed)
 Chronic GERD controlled Continue pantoprazole 40 mg daily

## 2024-04-05 NOTE — Assessment & Plan Note (Signed)
 Chronic Bilateral hip pain Worse when she first stands up and improves with walking more Does not feel like she needs to see anyone for this at this time.

## 2024-04-05 NOTE — Assessment & Plan Note (Signed)
 Bilateral middle fingers She works about every morning and then they typically okay Discussed possible referral to hand surgery She will monitor for now

## 2024-04-05 NOTE — Assessment & Plan Note (Signed)
Chronic Regular exercise and healthy diet encouraged Check lipid panel, CMP, TSH Continue pravastatin 40 mg daily

## 2024-04-10 ENCOUNTER — Ambulatory Visit
Admission: RE | Admit: 2024-04-10 | Discharge: 2024-04-10 | Disposition: A | Source: Ambulatory Visit | Attending: Internal Medicine

## 2024-04-10 DIAGNOSIS — M810 Age-related osteoporosis without current pathological fracture: Secondary | ICD-10-CM | POA: Diagnosis not present

## 2024-04-13 ENCOUNTER — Inpatient Hospital Stay
Admission: RE | Admit: 2024-04-13 | Discharge: 2024-04-13 | Attending: Obstetrics & Gynecology | Admitting: Obstetrics & Gynecology

## 2024-04-13 DIAGNOSIS — Z0000 Encounter for general adult medical examination without abnormal findings: Secondary | ICD-10-CM

## 2025-04-10 ENCOUNTER — Encounter: Admitting: Internal Medicine
